# Patient Record
Sex: Female | Born: 1950 | Race: White | Hispanic: No | Smoking: Current every day smoker
Health system: Southern US, Community
[De-identification: ages and names within clinical notes are randomized; demographics above are authoritative.]

## PROBLEM LIST (undated history)

## (undated) DIAGNOSIS — N189 Chronic kidney disease, unspecified: Secondary | ICD-10-CM

## (undated) DIAGNOSIS — E559 Vitamin D deficiency, unspecified: Secondary | ICD-10-CM

## (undated) DIAGNOSIS — M1990 Unspecified osteoarthritis, unspecified site: Secondary | ICD-10-CM

## (undated) DIAGNOSIS — R112 Nausea with vomiting, unspecified: Secondary | ICD-10-CM

## (undated) DIAGNOSIS — I739 Peripheral vascular disease, unspecified: Secondary | ICD-10-CM

## (undated) DIAGNOSIS — E039 Hypothyroidism, unspecified: Secondary | ICD-10-CM

## (undated) DIAGNOSIS — E213 Hyperparathyroidism, unspecified: Secondary | ICD-10-CM

## (undated) DIAGNOSIS — F329 Major depressive disorder, single episode, unspecified: Secondary | ICD-10-CM

## (undated) DIAGNOSIS — R52 Pain, unspecified: Secondary | ICD-10-CM

## (undated) DIAGNOSIS — F419 Anxiety disorder, unspecified: Secondary | ICD-10-CM

## (undated) DIAGNOSIS — Z9889 Other specified postprocedural states: Secondary | ICD-10-CM

## (undated) DIAGNOSIS — D649 Anemia, unspecified: Secondary | ICD-10-CM

## (undated) DIAGNOSIS — F32A Depression, unspecified: Secondary | ICD-10-CM

## (undated) DIAGNOSIS — K219 Gastro-esophageal reflux disease without esophagitis: Secondary | ICD-10-CM

## (undated) DIAGNOSIS — G4730 Sleep apnea, unspecified: Secondary | ICD-10-CM

## (undated) DIAGNOSIS — I10 Essential (primary) hypertension: Secondary | ICD-10-CM

## (undated) DIAGNOSIS — K760 Fatty (change of) liver, not elsewhere classified: Secondary | ICD-10-CM

## (undated) DIAGNOSIS — Z86718 Personal history of other venous thrombosis and embolism: Secondary | ICD-10-CM

## (undated) HISTORY — DX: Hypothyroidism, unspecified: E03.9

## (undated) HISTORY — DX: Chronic kidney disease, unspecified: N18.9

## (undated) HISTORY — PX: TONSILLECTOMY: SUR1361

## (undated) HISTORY — PX: OTHER SURGICAL HISTORY: SHX169

## (undated) HISTORY — DX: Essential (primary) hypertension: I10

## (undated) HISTORY — PX: JOINT REPLACEMENT: SHX530

## (undated) HISTORY — DX: Unspecified osteoarthritis, unspecified site: M19.90

## (undated) HISTORY — DX: Vitamin D deficiency, unspecified: E55.9

## (undated) HISTORY — DX: Fatty (change of) liver, not elsewhere classified: K76.0

## (undated) HISTORY — DX: Personal history of other venous thrombosis and embolism: Z86.718

## (undated) HISTORY — PX: BONE CYST EXCISION: SHX376

## (undated) HISTORY — DX: Hyperparathyroidism, unspecified: E21.3

---
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
## 1977-08-20 HISTORY — PX: TUBAL LIGATION: SHX77

## 2001-01-22 ENCOUNTER — Other Ambulatory Visit: Admission: RE | Admit: 2001-01-22 | Discharge: 2001-01-22 | Payer: Self-pay | Admitting: Obstetrics and Gynecology

## 2001-01-22 ENCOUNTER — Encounter (INDEPENDENT_AMBULATORY_CARE_PROVIDER_SITE_OTHER): Payer: Self-pay

## 2001-01-27 ENCOUNTER — Ambulatory Visit (HOSPITAL_COMMUNITY): Admission: RE | Admit: 2001-01-27 | Discharge: 2001-01-27 | Payer: Self-pay | Admitting: Gastroenterology

## 2002-08-24 ENCOUNTER — Encounter: Admission: RE | Admit: 2002-08-24 | Discharge: 2002-08-24 | Payer: Self-pay | Admitting: Obstetrics and Gynecology

## 2002-08-24 ENCOUNTER — Encounter: Payer: Self-pay | Admitting: Obstetrics and Gynecology

## 2002-09-09 ENCOUNTER — Encounter: Payer: Self-pay | Admitting: Obstetrics and Gynecology

## 2002-09-09 ENCOUNTER — Encounter: Admission: RE | Admit: 2002-09-09 | Discharge: 2002-09-09 | Payer: Self-pay | Admitting: Obstetrics and Gynecology

## 2004-12-27 ENCOUNTER — Observation Stay (HOSPITAL_COMMUNITY): Admission: EM | Admit: 2004-12-27 | Discharge: 2004-12-28 | Payer: Self-pay | Admitting: Emergency Medicine

## 2005-08-20 DIAGNOSIS — I739 Peripheral vascular disease, unspecified: Secondary | ICD-10-CM

## 2005-08-20 DIAGNOSIS — Z86718 Personal history of other venous thrombosis and embolism: Secondary | ICD-10-CM

## 2005-08-20 HISTORY — DX: Peripheral vascular disease, unspecified: I73.9

## 2005-08-20 HISTORY — PX: OTHER SURGICAL HISTORY: SHX169

## 2005-08-20 HISTORY — DX: Personal history of other venous thrombosis and embolism: Z86.718

## 2005-12-17 ENCOUNTER — Encounter: Payer: Self-pay | Admitting: Vascular Surgery

## 2005-12-17 ENCOUNTER — Ambulatory Visit: Admission: RE | Admit: 2005-12-17 | Discharge: 2005-12-17 | Payer: Self-pay | Admitting: Nephrology

## 2005-12-18 ENCOUNTER — Other Ambulatory Visit: Admission: RE | Admit: 2005-12-18 | Discharge: 2005-12-18 | Payer: Self-pay | Admitting: Obstetrics and Gynecology

## 2005-12-20 ENCOUNTER — Emergency Department (HOSPITAL_COMMUNITY): Admission: EM | Admit: 2005-12-20 | Discharge: 2005-12-20 | Payer: Self-pay | Admitting: Emergency Medicine

## 2005-12-20 ENCOUNTER — Ambulatory Visit: Admission: RE | Admit: 2005-12-20 | Discharge: 2005-12-20 | Payer: Self-pay | Admitting: Nephrology

## 2005-12-20 ENCOUNTER — Encounter (INDEPENDENT_AMBULATORY_CARE_PROVIDER_SITE_OTHER): Payer: Self-pay | Admitting: *Deleted

## 2005-12-21 ENCOUNTER — Encounter (INDEPENDENT_AMBULATORY_CARE_PROVIDER_SITE_OTHER): Payer: Self-pay | Admitting: Specialist

## 2005-12-21 ENCOUNTER — Ambulatory Visit (HOSPITAL_COMMUNITY): Admission: RE | Admit: 2005-12-21 | Discharge: 2005-12-23 | Payer: Self-pay | Admitting: Vascular Surgery

## 2006-02-18 ENCOUNTER — Encounter: Admission: RE | Admit: 2006-02-18 | Discharge: 2006-02-18 | Payer: Self-pay | Admitting: Emergency Medicine

## 2006-02-27 ENCOUNTER — Encounter: Admission: RE | Admit: 2006-02-27 | Discharge: 2006-02-27 | Payer: Self-pay | Admitting: Emergency Medicine

## 2006-03-18 ENCOUNTER — Encounter: Admission: RE | Admit: 2006-03-18 | Discharge: 2006-03-18 | Payer: Self-pay | Admitting: Nephrology

## 2006-08-20 HISTORY — PX: CARPAL TUNNEL RELEASE: SHX101

## 2007-01-10 ENCOUNTER — Ambulatory Visit: Payer: Self-pay | Admitting: Vascular Surgery

## 2007-09-18 ENCOUNTER — Encounter: Admission: RE | Admit: 2007-09-18 | Discharge: 2007-09-18 | Payer: Self-pay | Admitting: Nephrology

## 2007-09-19 ENCOUNTER — Ambulatory Visit: Payer: Self-pay | Admitting: Gastroenterology

## 2007-09-19 LAB — CONVERTED CEMR LAB
Ferritin: 46.2 ng/mL (ref 10.0–291.0)
HCV Ab: NEGATIVE
Hepatitis B Surface Ag: NEGATIVE
INR: 0.9 (ref 0.8–1.0)
Iron: 77 ug/dL (ref 42–145)
Prothrombin Time: 11.7 s (ref 10.9–13.3)
aPTT: 37.6 s — ABNORMAL HIGH (ref 21.7–29.8)

## 2007-09-26 ENCOUNTER — Ambulatory Visit (HOSPITAL_COMMUNITY): Admission: RE | Admit: 2007-09-26 | Discharge: 2007-09-26 | Payer: Self-pay | Admitting: Gastroenterology

## 2007-12-16 ENCOUNTER — Telehealth: Payer: Self-pay | Admitting: Gastroenterology

## 2008-03-04 ENCOUNTER — Encounter: Admission: RE | Admit: 2008-03-04 | Discharge: 2008-03-04 | Payer: Self-pay | Admitting: Emergency Medicine

## 2008-03-11 ENCOUNTER — Ambulatory Visit: Payer: Self-pay | Admitting: *Deleted

## 2008-07-26 ENCOUNTER — Encounter: Admission: RE | Admit: 2008-07-26 | Discharge: 2008-07-26 | Payer: Self-pay | Admitting: Emergency Medicine

## 2008-07-28 ENCOUNTER — Encounter: Admission: RE | Admit: 2008-07-28 | Discharge: 2008-07-28 | Payer: Self-pay | Admitting: Emergency Medicine

## 2008-08-20 HISTORY — PX: OTHER SURGICAL HISTORY: SHX169

## 2008-10-14 ENCOUNTER — Ambulatory Visit: Payer: Self-pay | Admitting: *Deleted

## 2008-10-27 ENCOUNTER — Ambulatory Visit: Payer: Self-pay | Admitting: Internal Medicine

## 2008-10-27 DIAGNOSIS — Z86718 Personal history of other venous thrombosis and embolism: Secondary | ICD-10-CM | POA: Insufficient documentation

## 2008-10-27 DIAGNOSIS — J4489 Other specified chronic obstructive pulmonary disease: Secondary | ICD-10-CM | POA: Insufficient documentation

## 2008-10-27 DIAGNOSIS — E039 Hypothyroidism, unspecified: Secondary | ICD-10-CM | POA: Insufficient documentation

## 2008-10-27 DIAGNOSIS — I10 Essential (primary) hypertension: Secondary | ICD-10-CM

## 2008-10-27 DIAGNOSIS — J449 Chronic obstructive pulmonary disease, unspecified: Secondary | ICD-10-CM

## 2008-10-27 LAB — CONVERTED CEMR LAB
ALT: 54 units/L — ABNORMAL HIGH (ref 0–35)
Albumin: 3.8 g/dL (ref 3.5–5.2)
Basophils Absolute: 0 10*3/uL (ref 0.0–0.1)
CO2: 30 meq/L (ref 19–32)
Chloride: 102 meq/L (ref 96–112)
Creatinine, Ser: 1.1 mg/dL (ref 0.4–1.2)
Eosinophils Absolute: 0.2 10*3/uL (ref 0.0–0.7)
GFR calc non Af Amer: 54 mL/min
HCT: 43.2 % (ref 36.0–46.0)
Hemoglobin: 14.9 g/dL (ref 12.0–15.0)
MCV: 86.9 fL (ref 78.0–100.0)
Monocytes Absolute: 0.6 10*3/uL (ref 0.1–1.0)
Neutrophils Relative %: 47 % (ref 43.0–77.0)
Potassium: 4.3 meq/L (ref 3.5–5.1)
RBC: 4.97 M/uL (ref 3.87–5.11)
Sodium: 140 meq/L (ref 135–145)
T3 Uptake Ratio: 38.6 % — ABNORMAL HIGH (ref 22.5–37.0)
T3, Free: 3.9 pg/mL (ref 2.3–4.2)
TSH: 0.06 microintl units/mL — ABNORMAL LOW (ref 0.35–5.50)
Total Protein: 7.3 g/dL (ref 6.0–8.3)
WBC: 6.2 10*3/uL (ref 4.5–10.5)

## 2008-10-28 ENCOUNTER — Encounter: Payer: Self-pay | Admitting: Internal Medicine

## 2008-11-02 ENCOUNTER — Telehealth: Payer: Self-pay | Admitting: Internal Medicine

## 2008-12-30 ENCOUNTER — Encounter: Admission: RE | Admit: 2008-12-30 | Discharge: 2008-12-30 | Payer: Self-pay | Admitting: Orthopedic Surgery

## 2008-12-31 ENCOUNTER — Ambulatory Visit: Payer: Self-pay | Admitting: Internal Medicine

## 2008-12-31 ENCOUNTER — Ambulatory Visit (HOSPITAL_BASED_OUTPATIENT_CLINIC_OR_DEPARTMENT_OTHER): Admission: RE | Admit: 2008-12-31 | Discharge: 2008-12-31 | Payer: Self-pay | Admitting: Orthopedic Surgery

## 2008-12-31 DIAGNOSIS — R7309 Other abnormal glucose: Secondary | ICD-10-CM | POA: Insufficient documentation

## 2008-12-31 DIAGNOSIS — E669 Obesity, unspecified: Secondary | ICD-10-CM | POA: Insufficient documentation

## 2008-12-31 LAB — CONVERTED CEMR LAB
BUN: 12 mg/dL (ref 6–23)
CO2: 31 meq/L (ref 19–32)
Creatinine, Ser: 1.1 mg/dL (ref 0.4–1.2)
GFR calc non Af Amer: 54.37 mL/min (ref >60)
Glucose, Bld: 115 mg/dL — ABNORMAL HIGH (ref 70–99)
Hgb A1c MFr Bld: 6 % (ref 4.6–6.5)
Potassium: 4.2 meq/L (ref 3.5–5.1)

## 2009-01-18 ENCOUNTER — Telehealth: Payer: Self-pay | Admitting: Internal Medicine

## 2009-01-18 ENCOUNTER — Encounter: Admission: RE | Admit: 2009-01-18 | Discharge: 2009-01-18 | Payer: Self-pay | Admitting: Internal Medicine

## 2009-01-26 ENCOUNTER — Telehealth: Payer: Self-pay | Admitting: Internal Medicine

## 2009-02-22 ENCOUNTER — Ambulatory Visit: Payer: Self-pay | Admitting: Internal Medicine

## 2009-05-06 ENCOUNTER — Ambulatory Visit: Payer: Self-pay | Admitting: Internal Medicine

## 2009-05-09 ENCOUNTER — Encounter: Payer: Self-pay | Admitting: Internal Medicine

## 2009-05-12 ENCOUNTER — Telehealth: Payer: Self-pay | Admitting: Internal Medicine

## 2009-06-06 ENCOUNTER — Emergency Department (HOSPITAL_COMMUNITY): Admission: EM | Admit: 2009-06-06 | Discharge: 2009-06-06 | Payer: Self-pay | Admitting: Emergency Medicine

## 2009-06-08 ENCOUNTER — Ambulatory Visit: Payer: Self-pay | Admitting: Internal Medicine

## 2009-06-08 DIAGNOSIS — R9431 Abnormal electrocardiogram [ECG] [EKG]: Secondary | ICD-10-CM

## 2009-06-08 LAB — CONVERTED CEMR LAB
BUN: 12 mg/dL (ref 6–23)
Calcium: 9.2 mg/dL (ref 8.4–10.5)
Chloride: 102 meq/L (ref 96–112)
Creatinine, Ser: 1 mg/dL (ref 0.4–1.2)
Magnesium: 2.4 mg/dL (ref 1.5–2.5)
Sodium: 142 meq/L (ref 135–145)

## 2009-06-13 ENCOUNTER — Telehealth (INDEPENDENT_AMBULATORY_CARE_PROVIDER_SITE_OTHER): Payer: Self-pay | Admitting: *Deleted

## 2009-06-14 ENCOUNTER — Encounter (HOSPITAL_COMMUNITY): Admission: RE | Admit: 2009-06-14 | Discharge: 2009-08-16 | Payer: Self-pay | Admitting: Internal Medicine

## 2009-06-14 ENCOUNTER — Ambulatory Visit: Payer: Self-pay

## 2009-06-14 ENCOUNTER — Ambulatory Visit: Payer: Self-pay | Admitting: Cardiology

## 2009-09-13 ENCOUNTER — Encounter (INDEPENDENT_AMBULATORY_CARE_PROVIDER_SITE_OTHER): Payer: Self-pay | Admitting: *Deleted

## 2009-09-13 ENCOUNTER — Ambulatory Visit: Payer: Self-pay | Admitting: Internal Medicine

## 2009-09-13 DIAGNOSIS — F17200 Nicotine dependence, unspecified, uncomplicated: Secondary | ICD-10-CM | POA: Insufficient documentation

## 2009-09-16 ENCOUNTER — Encounter: Payer: Self-pay | Admitting: Internal Medicine

## 2009-09-16 ENCOUNTER — Telehealth: Payer: Self-pay | Admitting: Internal Medicine

## 2009-10-04 ENCOUNTER — Encounter: Payer: Self-pay | Admitting: Internal Medicine

## 2009-10-31 ENCOUNTER — Telehealth: Payer: Self-pay | Admitting: Internal Medicine

## 2009-11-21 ENCOUNTER — Encounter: Payer: Self-pay | Admitting: Internal Medicine

## 2010-02-10 ENCOUNTER — Ambulatory Visit: Payer: Self-pay | Admitting: Internal Medicine

## 2010-02-13 ENCOUNTER — Encounter: Payer: Self-pay | Admitting: Internal Medicine

## 2010-02-15 ENCOUNTER — Telehealth: Payer: Self-pay | Admitting: Internal Medicine

## 2010-03-01 ENCOUNTER — Ambulatory Visit: Payer: Self-pay | Admitting: Internal Medicine

## 2010-04-04 ENCOUNTER — Encounter: Payer: Self-pay | Admitting: Internal Medicine

## 2010-04-04 LAB — CONVERTED CEMR LAB
Albumin: 4.3 g/dL
Calcium: 9.6 mg/dL
Creatinine, Ser: 1.14 mg/dL
GFR calc non Af Amer: 53 mL/min
Glucose, Urine, Semiquant: 122
HCT: 42.5 %
Hemoglobin: 14.9 g/dL
Lymphocytes, automated: 37 %
MCV: 85 fL
Neutrophils Relative %: 52 %
TSH: 0.495 microintl units/mL
Total Protein: 7.2 g/dL

## 2010-05-03 LAB — HM PAP SMEAR: HM Pap smear: NORMAL

## 2010-05-13 ENCOUNTER — Emergency Department (HOSPITAL_COMMUNITY): Admission: EM | Admit: 2010-05-13 | Discharge: 2010-05-13 | Payer: Self-pay | Admitting: Emergency Medicine

## 2010-05-23 ENCOUNTER — Ambulatory Visit: Payer: Self-pay | Admitting: Vascular Surgery

## 2010-05-29 ENCOUNTER — Ambulatory Visit: Payer: Self-pay | Admitting: Internal Medicine

## 2010-05-29 ENCOUNTER — Telehealth (INDEPENDENT_AMBULATORY_CARE_PROVIDER_SITE_OTHER): Payer: Self-pay | Admitting: *Deleted

## 2010-05-29 DIAGNOSIS — M159 Polyosteoarthritis, unspecified: Secondary | ICD-10-CM

## 2010-05-29 DIAGNOSIS — M79609 Pain in unspecified limb: Secondary | ICD-10-CM

## 2010-06-01 ENCOUNTER — Telehealth: Payer: Self-pay | Admitting: Internal Medicine

## 2010-06-14 ENCOUNTER — Encounter: Payer: Self-pay | Admitting: Internal Medicine

## 2010-06-29 IMAGING — US US ASPIRATION
1 series · 8 of 8 positions shown · non-contrast
Comparison: 07/26/2008

CLINICAL DATA: Left knee Baker's cyst.

US ASPIRATION

[Series 1: us aspiration · 0.08mm/px · 8 acquisitions, 8 frames shown]
[im 1/8]
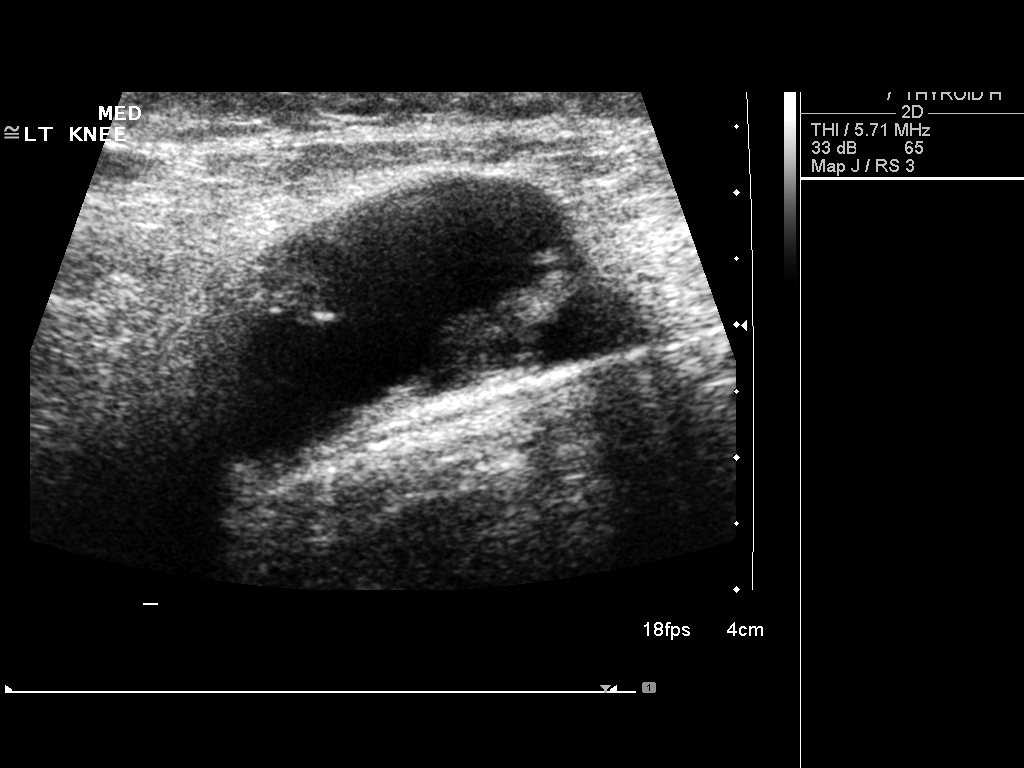
[im 2/8]
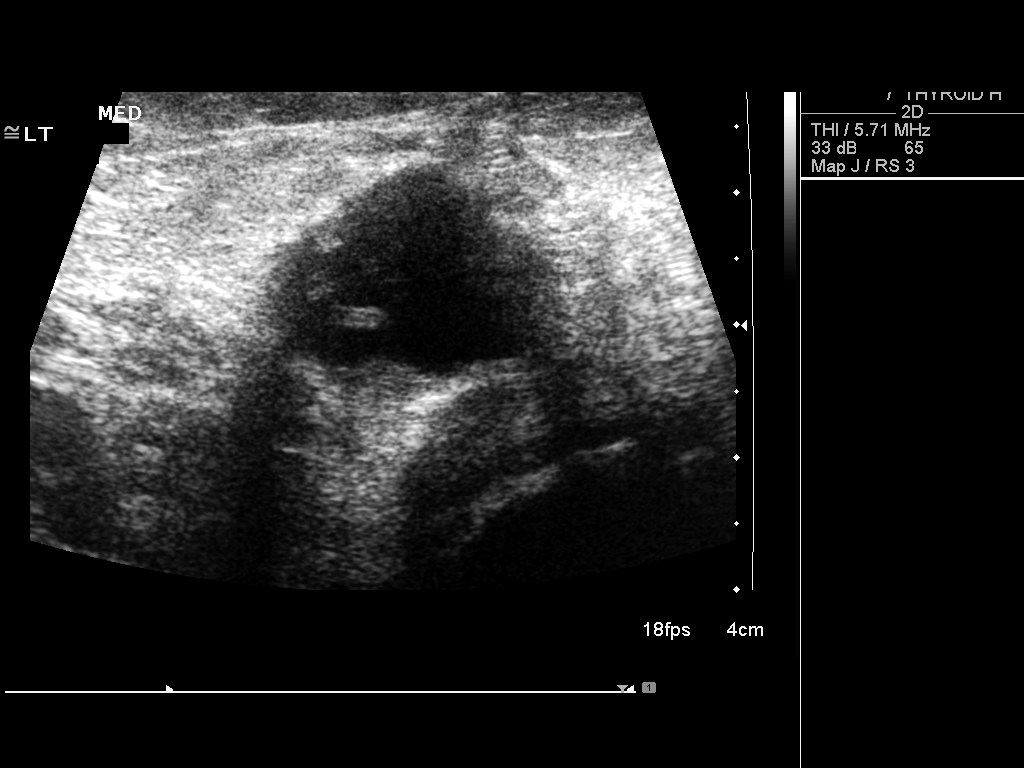
[im 3/8]
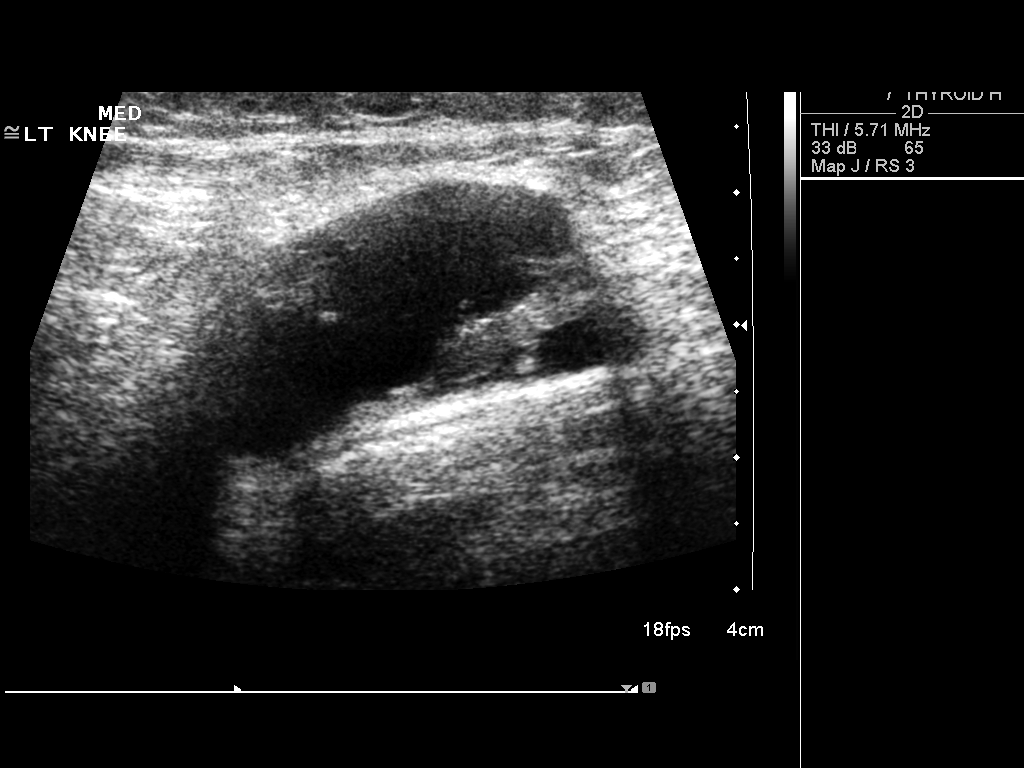
[im 4/8]
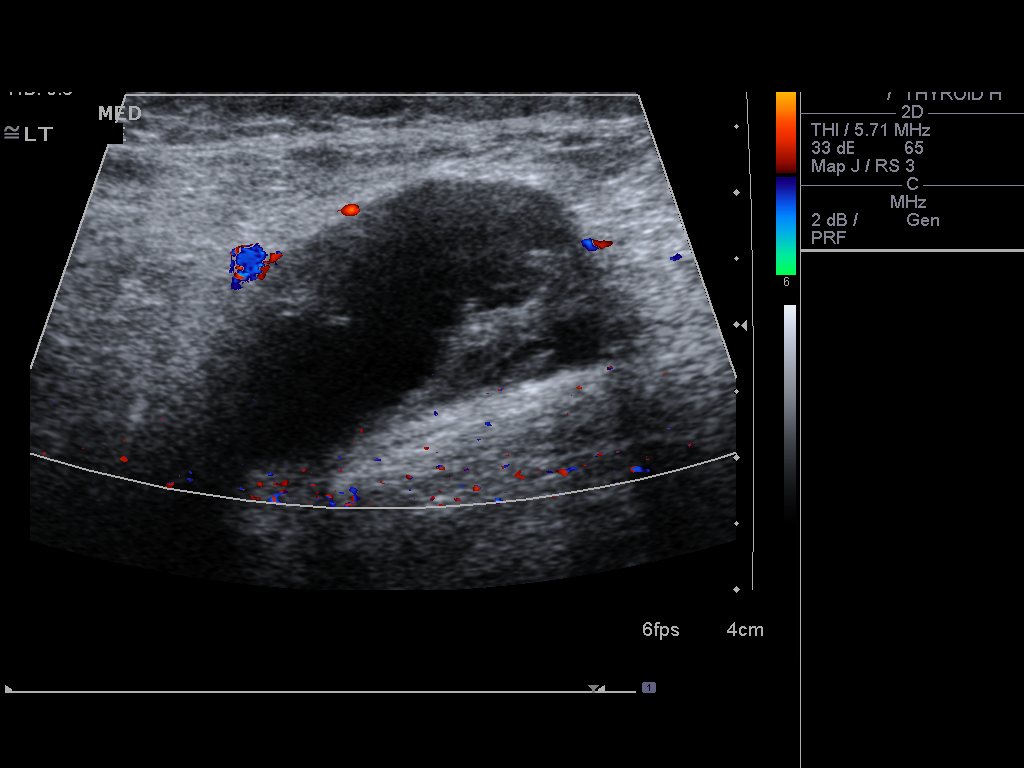
[im 5/8]
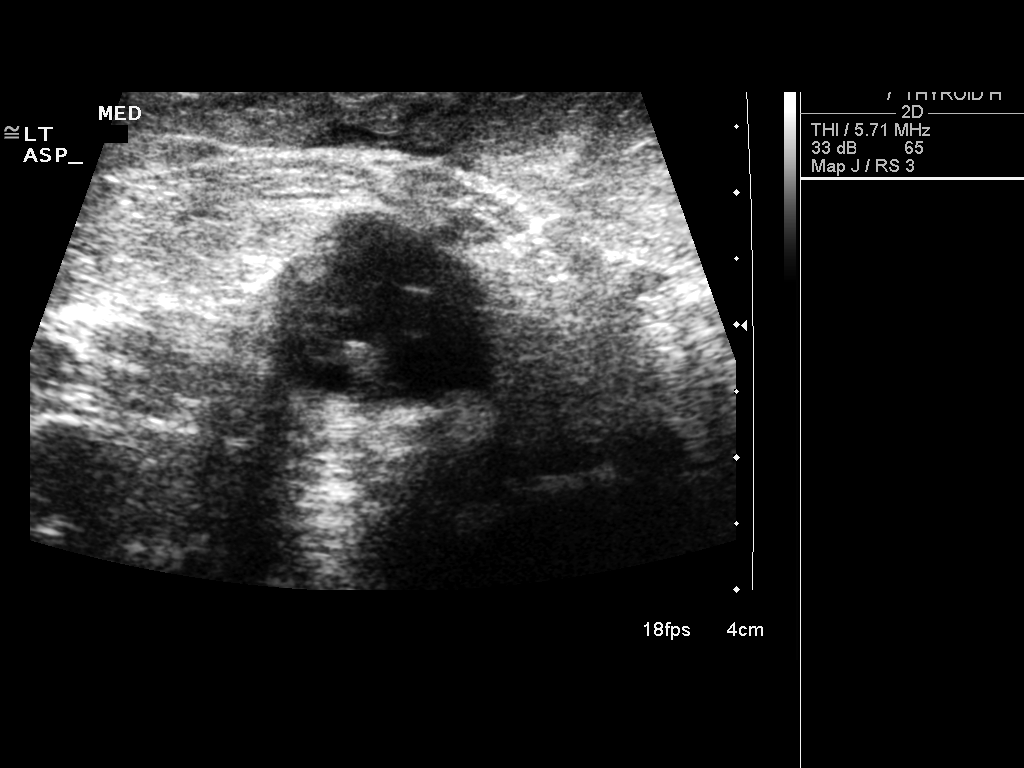
[im 6/8]
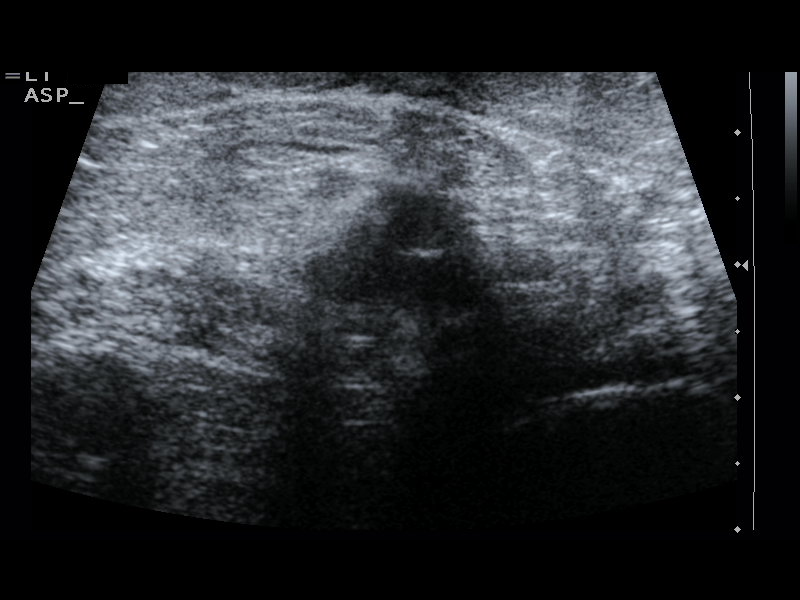
[im 7/8]
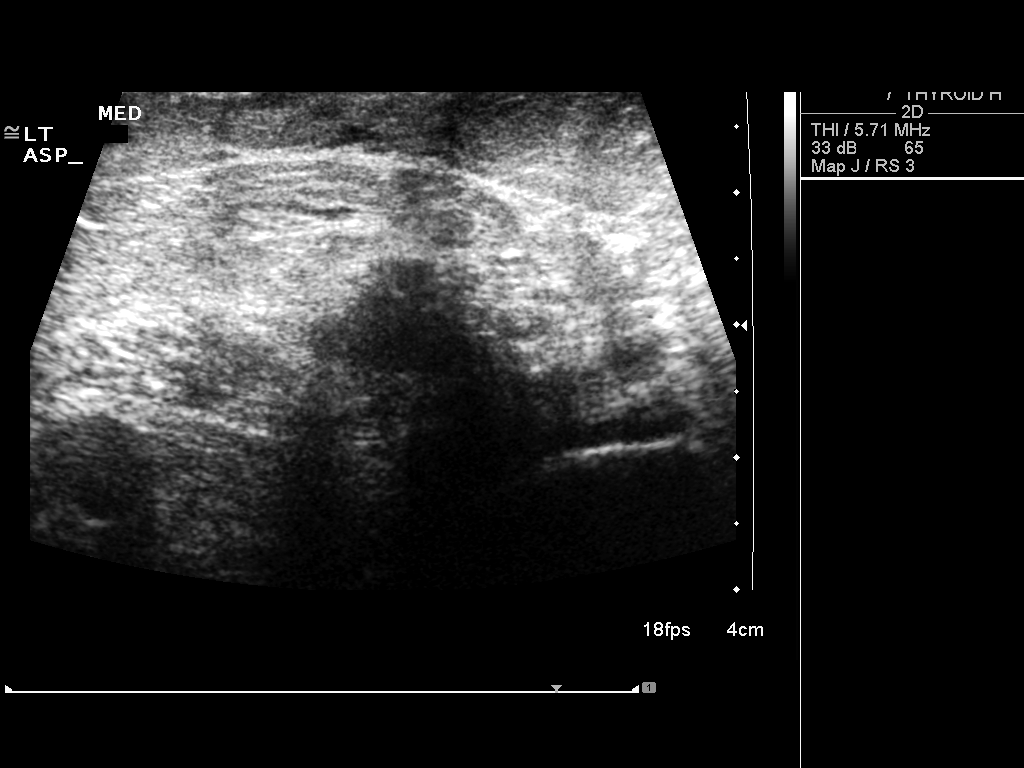
[im 8/8]
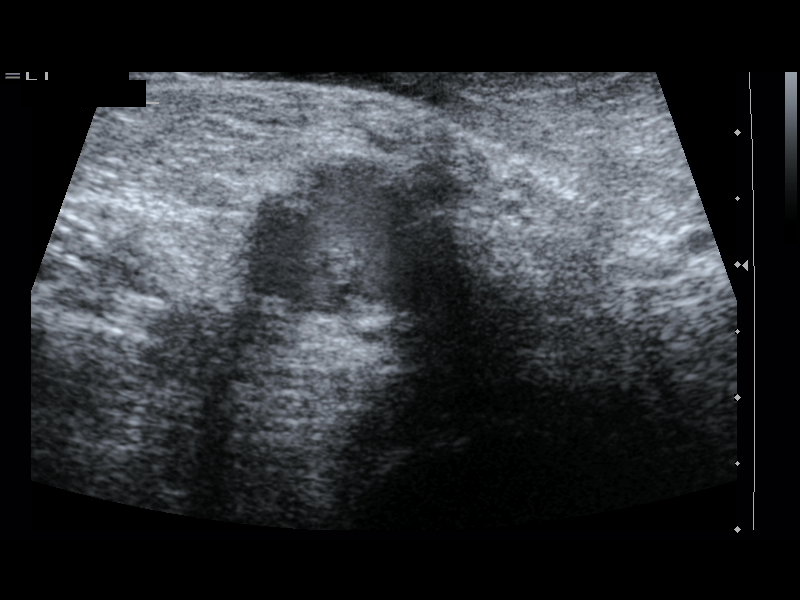

[8 of 8 positions shown; findings below may reference images not displayed]

FINDINGS: Written informed consent was obtained and the patient for
the procedure.  The patient was placed prone on the ultrasound
table and the left posterior knee was prepped and draped in sterile
fashion.  Ultrasound demonstrates a complex fluid collection in the
popliteal fossa.  The skin was anesthetized with 1% lidocaine.  A
22 gauge needle was advanced into the popliteal cyst with
ultrasound guidance.  Approximately 5 ml of clear yellow fluid was
aspirated.  A mixture of 5 ml of Sensorcaine and 80 mg of Depo-
Medrol was injected into the Baker's cyst.  The patient tolerated
procedure well.
IMPRESSION: Successful aspiration and injection of the left Baker's cyst as
above.

## 2010-08-04 ENCOUNTER — Ambulatory Visit: Payer: Self-pay | Admitting: Internal Medicine

## 2010-08-31 ENCOUNTER — Encounter: Payer: Self-pay | Admitting: Internal Medicine

## 2010-08-31 ENCOUNTER — Ambulatory Visit
Admission: RE | Admit: 2010-08-31 | Discharge: 2010-08-31 | Payer: Self-pay | Source: Home / Self Care | Attending: Internal Medicine | Admitting: Internal Medicine

## 2010-08-31 DIAGNOSIS — H8309 Labyrinthitis, unspecified ear: Secondary | ICD-10-CM | POA: Insufficient documentation

## 2010-09-12 ENCOUNTER — Encounter: Payer: Self-pay | Admitting: Internal Medicine

## 2010-09-12 LAB — CONVERTED CEMR LAB
ALT: 43 units/L
Alkaline Phosphatase: 86 units/L
BUN: 16 mg/dL
Calcium: 9.6 mg/dL
Chloride: 103 meq/L
Creatinine, Ser: 0.98 mg/dL
Eosinophils Relative: 0.1 %
GFR calc non Af Amer: 64 mL/min
Glucose, Bld: 105 mg/dL
MCV: 80 fL
Potassium: 4.3 meq/L
RBC: 5.3 M/uL
RDW: 15.6 %
Sodium: 140 meq/L
TSH: 0.325 microintl units/mL
Total Bilirubin: 0.4 mg/dL
Total Protein: 7.4 g/dL
WBC: 7.4 10*3/uL

## 2010-09-14 ENCOUNTER — Ambulatory Visit
Admission: RE | Admit: 2010-09-14 | Discharge: 2010-09-14 | Payer: Self-pay | Source: Home / Self Care | Attending: Internal Medicine | Admitting: Internal Medicine

## 2010-09-14 DIAGNOSIS — R740 Nonspecific elevation of levels of transaminase and lactic acid dehydrogenase [LDH]: Secondary | ICD-10-CM

## 2010-09-14 DIAGNOSIS — E559 Vitamin D deficiency, unspecified: Secondary | ICD-10-CM | POA: Insufficient documentation

## 2010-09-14 DIAGNOSIS — K7689 Other specified diseases of liver: Secondary | ICD-10-CM | POA: Insufficient documentation

## 2010-09-21 NOTE — Progress Notes (Signed)
Summary: Work note/ VAL OV  Phone Note Call from Patient Call back at [employer] [phone]   Caller: Patient Summary of Call: pt called stating that she was unable to go to work today and is requesting an extension to previous work note to include today. Initial call taken by: Margaret Pyle, CMA,  September 16, 2009 11:36 AM  Follow-up for Phone Call        ok Follow-up by: Etta Grandchild MD,  September 16, 2009 1:27 PM  Additional Follow-up for Phone Call Additional follow up Details #1::        Patient notified and letter done Additional Follow-up by: Rock Nephew CMA,  September 16, 2009 3:24 PM

## 2010-09-21 NOTE — Letter (Signed)
Summary: Out of Work  [facility]  [address] [place], [place] [address]   Phone: [phone]  Fax: [phone]    August 31, 2010   Employee:  QIANNA CLAGETT Lanes    To Whom It May Concern:   For Medical reasons, please excuse the above named employee from work for the following dates:  Start:   08/31/10  End:   09/04/10  If you need additional information, please feel free to contact our office.         Sincerely,    Etta Grandchild MD

## 2010-09-21 NOTE — Assessment & Plan Note (Signed)
Summary: FU  STC   Vital Signs:  Patient profile:   60 year old female Height:      66 inches Weight:      273 pounds BMI:     44.22 O2 Sat:      95 % on Room air Temp:     98.1 degrees F oral Pulse rate:   75 / minute Pulse rhythm:   regular Resp:     16 per minute BP sitting:   138 / 82  (left arm) Cuff size:   large  Vitals Entered By: Rock Nephew CMA (February 10, 2010 3:16 PM)  Nutrition Counseling: Patient's BMI is greater than 25 and therefore counseled on weight management options.  O2 Flow:  Room air CC: follow up and med refills, Hypertension Management Is Patient Diabetic? No Pain Assessment Patient in pain? no        Primary Care Provider:  Etta Grandchild MD  CC:  follow up and med refills and Hypertension Management.  History of Present Illness:  Follow-Up Visit      This is a 60 year old woman who presents for Follow-up visit.  The patient denies chest pain, palpitations, dizziness, syncope, low blood sugar symptoms, high blood sugar symptoms, edema, SOB, DOE, PND, and orthopnea.  Since the last visit the patient notes no new problems or concerns.  The patient reports taking meds as prescribed, monitoring BP, monitoring blood sugars, and dietary noncompliance.  When questioned about possible medication side effects, the patient notes none.    Hypertension History:      She denies headache, chest pain, palpitations, dyspnea with exertion, orthopnea, PND, peripheral edema, visual symptoms, neurologic problems, syncope, and side effects from treatment.  She notes no problems with any antihypertensive medication side effects.        Positive major cardiovascular risk factors include female age 59 years old or older and hypertension.  Negative major cardiovascular risk factors include no history of diabetes or hyperlipidemia, negative family history for ischemic heart disease, and non-tobacco-user status.        Further assessment for target organ damage reveals no  history of ASHD, cardiac end-organ damage (CHF/LVH), stroke/TIA, peripheral vascular disease, renal insufficiency, or hypertensive retinopathy.     Preventive Screening-Counseling & Management  Alcohol-Tobacco     Alcohol drinks/day: 0     Smoking Status: never     Smoking Cessation Counseling: yes     Smoke Cessation Stage: precontemplative     Tobacco Counseling: to quit use of tobacco products  Caffeine-Diet-Exercise     Does Patient Exercise: no     Exercise Counseling: to improve exercise regimen     PHQ-9 Score: 1-4 minimal depression  Hep-HIV-STD-Contraception     Hepatitis Risk: no risk noted     HIV Risk: no risk noted     STD Risk: no risk noted      Drug Use:  no.    Clinical Review Panels:  Diabetes Management   HgBA1C:  6.0 (12/31/2008)   Creatinine:  1.0 (06/08/2009)  CBC   WBC:  6.2 (10/27/2008)   RBC:  4.97 (10/27/2008)   Hgb:  14.9 (10/27/2008)   Hct:  43.2 (10/27/2008)   Platelets:  198 (10/27/2008)   MCV  86.9 (10/27/2008)   MCHC  34.6 (10/27/2008)   RDW  12.5 (10/27/2008)   PMN:  47.0 (10/27/2008)   Lymphs:  38.8 (10/27/2008)   Monos:  10.2 (10/27/2008)   Eosinophils:  3.6 (10/27/2008)   Basophil:  0.4 (10/27/2008)  Complete Metabolic Panel   Glucose:  83 (06/08/2009)   Sodium:  142 (06/08/2009)   Potassium:  3.1 (06/08/2009)   Chloride:  102 (06/08/2009)   CO2:  32 (06/08/2009)   BUN:  12 (06/08/2009)   Creatinine:  1.0 (06/08/2009)   Albumin:  3.8 (10/27/2008)   Total Protein:  7.3 (10/27/2008)   Calcium:  9.2 (06/08/2009)   Total Bili:  0.8 (10/27/2008)   Alk Phos:  77 (10/27/2008)   SGPT (ALT):  54 (10/27/2008)   SGOT (AST):  44 (10/27/2008)   Medications Prior to Update: 1)  Wellbutrin Xl 300 Mg Xr24h-Tab (Bupropion Hcl) .... Take 1 Tablet By Mouth Once A Day 2)  Hydrochlorothiazide 12.5 Mg Tabs (Hydrochlorothiazide) .... Take 1 Tablet By Mouth Once A Day 3)  Clonazepam 0.5 Mg Tabs (Clonazepam) .... Take 1 Tablet By Mouth Two  Times A Day 4)  Zoloft 100 Mg Tabs (Sertraline Hcl) .... Take 1 Tablet By Mouth Once A Day 5)  Protonix 40 Mg Tbec (Pantoprazole Sodium) .... Take 1 Tablet By Mouth Once A Day 6)  Bayer Aspirin 81 7)  Toprol Xl 50 Mg Xr24h-Tab (Metoprolol Succinate) .... Once Daily 8)  Klor-Con 10 10 Meq Cr-Tabs (Potassium Chloride) .... One By Mouth Two Times A Day With Food 9)  Synthroid 100 Mcg Tabs (Levothyroxine Sodium) .... Take 1 By Mouth Qd 10)  Augmentin 875-125 Mg Tabs (Amoxicillin-Pot Clavulanate) .Marland Kitchen.. 1 By Mouth Two Times A Day X 7 Days 11)  Tussionex Pennkinetic Er 8-10 Mg/79ml Lqcr (Chlorpheniramine-Hydrocodone) .... 5 Cc Every 12 H By Mouth As Needed For Severe Cough  Current Medications (verified): 1)  Wellbutrin Xl 300 Mg Xr24h-Tab (Bupropion Hcl) .... Take 1 Tablet By Mouth Once A Day 2)  Hydrochlorothiazide 12.5 Mg Tabs (Hydrochlorothiazide) .... Take 1 Tablet By Mouth Once A Day 3)  Clonazepam 0.5 Mg Tabs (Clonazepam) .... Take 1 Tablet By Mouth Two Times A Day 4)  Zoloft 100 Mg Tabs (Sertraline Hcl) .... Take 1 Tablet By Mouth Once A Day 5)  Protonix 40 Mg Tbec (Pantoprazole Sodium) .... Take 1 Tablet By Mouth Once A Day 6)  Bayer Aspirin 81 7)  Toprol Xl 50 Mg Xr24h-Tab (Metoprolol Succinate) .... Once Daily 8)  Klor-Con 10 10 Meq Cr-Tabs (Potassium Chloride) .... One By Mouth Two Times A Day With Food 9)  Synthroid 150 Mcg Tabs (Levothyroxine Sodium) .... Take 1 Tablet By Mouth Once A Day  Allergies (verified): 1)  ! Sulfa 2)  ! 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 Family History of Arthritis  Social History: Reviewed history from 12/31/2008 and no changes required. Occupation: Civil engineer, contracting at [employer] Alcohol use-no Drug use-no Regular exercise-no Hepatitis Risk:  no risk noted HIV Risk:  no risk noted STD Risk:  no risk noted  Review of Systems Endo:  Denies cold intolerance, excessive hunger, excessive thirst, excessive urination, heat intolerance, polyuria, and weight change.  Physical Exam  General:  alert, well-developed, well-nourished, and cooperative to examination.  overweight-appearing.   Head:  normocephalic and atraumatic.   Mouth:  teeth and gums in good repair; mucous membranes moist, without lesions or ulcers. oropharynx clear without exudate, mild erythema.  Neck:  supple, full ROM, no masses, no thyromegaly, and no neck tenderness.   Lungs:  normal respiratory effort, no intercostal retractions, no accessory muscle use, normal breath sounds, no dullness, no crackles, and no wheezes.   Heart:  normal rate, regular rhythm, no murmur, no gallop, no rub, and no JVD.   Abdomen:  soft, non-tender, normal bowel sounds, no distention, no masses, no guarding, no rigidity, no rebound tenderness, no abdominal hernia, no hepatomegaly, and no splenomegaly.   Msk:  normal ROM, no joint tenderness, no joint swelling, no joint warmth, no redness over joints, and no joint deformities.   Pulses:  R and L carotid,radial,femoral,dorsalis pedis and posterior tibial pulses are full and equal bilaterally Extremities:  No clubbing, cyanosis, edema, or deformity noted with normal full range of motion of all joints.  medium-sized varicosities noted on both legs. Neurologic:  No cranial nerve deficits noted. Station and gait are  normal. Plantar reflexes are down-going bilaterally. DTRs are symmetrical throughout. Sensory, motor and coordinative functions appear intact. Skin:  Intact without suspicious lesions or rashes Cervical Nodes:  No lymphadenopathy noted Psych:  Cognition and judgment appear intact. Alert and cooperative with normal attention span and concentration. No apparent delusions, illusions, hallucinations   Impression & Recommendations:  Problem # 1:  SMOKER (ICD-305.1) Assessment Unchanged  Encouraged smoking cessation and discussed different methods for smoking cessation.   Problem # 2:  HYPOKALEMIA (ICD-276.8) Assessment: Unchanged  Orders: Venipuncture ([address]) TLB-BMP (Basic Metabolic Panel-BMET) (80048-METABOL) TLB-Hepatic/Liver Function Pnl (80076-HEPATIC) TLB-TSH (Thyroid Stimulating Hormone) (84443-TSH) TLB-A1C / Hgb A1C (Glycohemoglobin) (83036-A1C)  Problem # 3:  HYPERGLYCEMIA, BORDERLINE (ICD-790.29) Assessment: Unchanged  Orders: Venipuncture ([address]) TLB-BMP (Basic Metabolic Panel-BMET) (80048-METABOL) TLB-Hepatic/Liver Function Pnl (80076-HEPATIC) TLB-TSH (Thyroid Stimulating Hormone) (84443-TSH) TLB-A1C / Hgb A1C (Glycohemoglobin) (83036-A1C)  Labs Reviewed: Creat: 1.0 (06/08/2009)     Problem # 4:  HYPOTHYROIDISM (ICD-244.9) Assessment: Unchanged  Her updated medication list for this problem includes:    Synthroid 150 Mcg Tabs (Levothyroxine sodium) .Marland Kitchen... Take 1 tablet by mouth once a day  Orders: Venipuncture ([address]) TLB-BMP (Basic Metabolic Panel-BMET) (80048-METABOL) TLB-Hepatic/Liver Function Pnl (80076-HEPATIC) TLB-TSH (Thyroid Stimulating Hormone) (84443-TSH) TLB-A1C / Hgb A1C (Glycohemoglobin) (83036-A1C)  Labs Reviewed: TSH: 0.06 (10/27/2008)   Total T4: 10.2 (10/27/2008)    HgBA1c: 6.0 (12/31/2008)  Problem # 5:  HYPERTENSION (ICD-401.9) Assessment: Improved  Her updated medication list for this problem includes:    Hydrochlorothiazide 12.5 Mg  Tabs (Hydrochlorothiazide) .Marland Kitchen... Take 1 tablet by mouth once a day    Toprol Xl 50 Mg Xr24h-tab (Metoprolol succinate) ..... Once daily  Orders: Venipuncture ([address]) TLB-BMP (Basic Metabolic Panel-BMET) (80048-METABOL) TLB-Hepatic/Liver Function Pnl (80076-HEPATIC) TLB-TSH (Thyroid Stimulating Hormone) (84443-TSH) TLB-A1C / Hgb A1C (Glycohemoglobin) (83036-A1C)  BP today: 138/82 Prior BP: 132/88 (09/13/2009)  Prior 10 Yr Risk Heart Disease: Not enough information (02/22/2009)  Labs Reviewed: K+: 3.1 (06/08/2009) Creat: : 1.0 (06/08/2009)     Complete Medication List: 1)  Wellbutrin Xl 300 Mg Xr24h-tab (Bupropion hcl) .... Take 1 tablet by mouth once a day 2)  Hydrochlorothiazide 12.5 Mg Tabs (Hydrochlorothiazide) .... Take 1 tablet by mouth once a day 3)  Clonazepam 0.5 Mg Tabs (Clonazepam) .... Take 1 tablet by mouth two times a day 4)  Zoloft 100 Mg Tabs (Sertraline hcl) .... Take 1 tablet by mouth once a  day 5)  Protonix 40 Mg Tbec (Pantoprazole sodium) .... Take 1 tablet by mouth once a day 6)  Bayer Aspirin 81  7)  Toprol Xl 50 Mg Xr24h-tab (Metoprolol succinate) .... Once daily 8)  Klor-con 10 10 Meq Cr-tabs (Potassium chloride) .... One by mouth two times a day with food 9)  Synthroid 150 Mcg Tabs (Levothyroxine sodium) .... Take 1 tablet by mouth once a day  Hypertension Assessment/Plan:      The patient's hypertensive risk group is category B: At least one risk factor (excluding diabetes) with no target organ damage.  Today's blood pressure is 138/82.  Her blood pressure goal is < 140/90.  Patient Instructions: 1)  Please schedule a follow-up appointment in 4 months. 2)  Tobacco is very bad for your health and your loved ones! You Should stop smoking!. 3)  Stop Smoking Tips: Choose a Quit date. Cut down before the Quit date. decide what you will do as a substitute when you feel the urge to smoke(gum,toothpick,exercise). 4)  It is important that you exercise regularly at  least 20 minutes 5 times a week. If you develop chest pain, have severe difficulty breathing, or feel very tired , stop exercising immediately and seek medical attention. 5)  You need to lose weight. Consider a lower calorie diet and regular exercise.  6)  Check your Blood Pressure regularly. If it is above 130/80: you should make an appointment. Prescriptions: CLONAZEPAM 0.5 MG TABS (CLONAZEPAM) Take 1 tablet by mouth two times a day  #180 x 1   Entered by:   Rock Nephew CMA   Authorized by:   Etta Grandchild MD   Signed by:   Rock Nephew CMA on 02/10/2010   Method used:   Print then Give to Patient   RxID:   [id number] KLOR-CON 10 10 MEQ CR-TABS (POTASSIUM CHLORIDE) One by mouth two times a day with food  #90 x 3   Entered by:   Rock Nephew CMA   Authorized by:   Etta Grandchild MD   Signed by:   Rock Nephew CMA on 02/10/2010   Method used:   Print then Give to Patient   RxID:   [id number] SYNTHROID 150 MCG TABS (LEVOTHYROXINE SODIUM) Take 1 tablet by mouth once a day  #90 x 3   Entered by:   Rock Nephew CMA   Authorized by:   Etta Grandchild MD   Signed by:   Rock Nephew CMA on 02/10/2010   Method used:   Print then Give to Patient   RxID:   [id number] TOPROL XL 50 MG XR24H-TAB (METOPROLOL SUCCINATE) once daily  #90 x 3   Entered by:   Rock Nephew CMA   Authorized by:   Etta Grandchild MD   Signed by:   Rock Nephew CMA on 02/10/2010   Method used:   Print then Give to Patient   RxID:   [id number] PROTONIX 40 MG TBEC (PANTOPRAZOLE SODIUM) Take 1 tablet by mouth once a day  #90 x 3   Entered by:   Rock Nephew CMA   Authorized by:   Etta Grandchild MD   Signed by:   Rock Nephew CMA on 02/10/2010   Method used:   Print then Give to Patient   RxID:   [id number] ZOLOFT 100 MG TABS (SERTRALINE HCL) Take 1 tablet by mouth once a day  #90 x 3   Entered by:   Rock Nephew CMA   Authorized by:  Etta Grandchild MD   Signed by:    Rock Nephew CMA on 02/10/2010   Method used:   Print then Give to Patient   RxID:   [id number] HYDROCHLOROTHIAZIDE 12.5 MG TABS (HYDROCHLOROTHIAZIDE) Take 1 tablet by mouth once a day  #90 x 3   Entered by:   Rock Nephew CMA   Authorized by:   Etta Grandchild MD   Signed by:   Rock Nephew CMA on 02/10/2010   Method used:   Print then Give to Patient   RxID:   [id number] WELLBUTRIN XL 300 MG XR24H-TAB (BUPROPION HCL) Take 1 tablet by mouth once a day  #90 x 3   Entered by:   Rock Nephew CMA   Authorized by:   Etta Grandchild MD   Signed by:   Rock Nephew CMA on 02/10/2010   Method used:   Print then Give to Patient   RxID:   [id number]

## 2010-09-21 NOTE — Assessment & Plan Note (Signed)
Summary: CONGESTION/NWS   Vital Signs:  Patient profile:   60 year old female Height:      66 inches (167.64 cm) Weight:      263.12 pounds (119.60 kg) O2 Sat:      97 % on Room air Temp:     97.5 degrees F (36.39 degrees C) oral Pulse rate:   64 / minute BP sitting:   132 / 88  (left arm) Cuff size:   large  Vitals Entered By: Orlan Leavens (September 13, 2009 2:41 PM)  O2 Flow:  Room air CC: chest congestion & cough. Pt states she has completed azithromycin 250mg   Is Patient Diabetic? No Pain Assessment Patient in pain? no        Primary Care Provider:  Etta Grandchild MD  CC:  chest congestion & cough. Pt states she has completed azithromycin 250mg  .  History of Present Illness: here today with complaint of chest congestion and cough. onset of symptoms was 5 days ago. course has been gradual onset and now occurs in daily but waxing/waning pattern. problem precipitated by sick contacts problem associated with yellow sputum and fatigue but not associated with fever, rash, ST or vision changes. symptoms improved by nothing - took zpack, tylenol cold and sinus. symptoms worsened with lying flat at night. no recent hx of same symptoms.   Current Medications (verified): 1)  Wellbutrin Xl 300 Mg Xr24h-Tab (Bupropion Hcl) .... Take 1 Tablet By Mouth Once A Day 2)  Hydrochlorothiazide 12.5 Mg Tabs (Hydrochlorothiazide) .... Take 1 Tablet By Mouth Once A Day 3)  Clonazepam 0.5 Mg Tabs (Clonazepam) .... Take 1 Tablet By Mouth Two Times A Day 4)  Zoloft 100 Mg Tabs (Sertraline Hcl) .... Take 1 Tablet By Mouth Once A Day 5)  Protonix 40 Mg Tbec (Pantoprazole Sodium) .... Take 1 Tablet By Mouth Once A Day 6)  Bayer Aspirin 81 7)  Toprol Xl 50 Mg Xr24h-Tab (Metoprolol Succinate) .... Once Daily 8)  Klor-Con 10 10 Meq Cr-Tabs (Potassium Chloride) .... One By Mouth Two Times A Day With Food 9)  Synthroid 100 Mcg Tabs (Levothyroxine Sodium) .... Take 1 By Mouth Qd  Allergies  (verified): 1)  ! Sulfa 2)  ! Erythromycin  Past History:  Past Medical History: Last updated: 06/08/2009 COPD DVT, hx of Hypertension Hypothyroidism Fatty liver disease  Review of Systems  The patient denies fever, hoarseness, chest pain, syncope, and hemoptysis.    Physical Exam  General:  alert, well-developed, well-nourished, and cooperative to examination.   overweight-appearing.   Eyes:  vision grossly intact; pupils equal, round and reactive to light.  conjunctiva and lids normal.    Ears:  normal pinnae bilaterally, without erythema, swelling, or tenderness to palpation. TMs clear, without effusion, or cerumen impaction. Hearing grossly normal bilaterally  Mouth:  teeth and gums in good repair; mucous membranes moist, without lesions or ulcers. oropharynx clear without exudate, mild erythema.  Lungs:  few rhonchi bilaterally, no w/c - no inc WOB-  Heart:  normal rate, regular rhythm, no murmur, and no rub. BLE without edema   Impression & Recommendations:  Problem # 1:  ACUTE BRONCHITIS (ICD-466.0)  no bronchospasm but +deep cough  - given COPD hx, will tx with abx to prevent further decompensation, no steroids needed today Her updated medication list for this problem includes:    Augmentin 875-125 Mg Tabs (Amoxicillin-pot clavulanate) .Marland Kitchen... 1 by mouth two times a day x 7 days    Tussionex Pennkinetic Er 8-10  Mg/79ml Lqcr (Chlorpheniramine-hydrocodone) .Marland KitchenMarland KitchenMarland KitchenMarland Kitchen 5 cc every 12 h by mouth as needed for severe cough  Take antibiotics and other medications as directed. Encouraged to push clear liquids, get enough rest, and take acetaminophen as needed. To be seen in 5-7 days if no improvement, sooner if worse.  Orders: Prescription Created Electronically [phone]) Tobacco use cessation intermediate 3-10 minutes ([address]406)  Problem # 2:  SMOKER (ICD-305.1)  5 minutes today spent on patient education regarding the unhealthy effects of continued tobacco abuse and encouragment of  cessation including medical options available to help patient to quit smoking.   Orders: Tobacco use cessation intermediate 3-10 minutes (99406)  Complete Medication List: 1)  Wellbutrin Xl 300 Mg Xr24h-tab (Bupropion hcl) .... Take 1 tablet by mouth once a day 2)  Hydrochlorothiazide 12.5 Mg Tabs (Hydrochlorothiazide) .... Take 1 tablet by mouth once a day 3)  Clonazepam 0.5 Mg Tabs (Clonazepam) .... Take 1 tablet by mouth two times a day 4)  Zoloft 100 Mg Tabs (Sertraline hcl) .... Take 1 tablet by mouth once a day 5)  Protonix 40 Mg Tbec (Pantoprazole sodium) .... Take 1 tablet by mouth once a day 6)  Bayer Aspirin 81  7)  Toprol Xl 50 Mg Xr24h-tab (Metoprolol succinate) .... Once daily 8)  Klor-con 10 10 Meq Cr-tabs (Potassium chloride) .... One by mouth two times a day with food 9)  Synthroid 100 Mcg Tabs (Levothyroxine sodium) .... Take 1 by mouth qd 10)  Augmentin 875-125 Mg Tabs (Amoxicillin-pot clavulanate) .Marland Kitchen.. 1 by mouth two times a day x 7 days 11)  Tussionex Pennkinetic Er 8-10 Mg/25ml Lqcr (Chlorpheniramine-hydrocodone) .... 5 cc every 12 h by mouth as needed for severe cough  Patient Instructions: 1)  Augmentin for your bronchitis and Tussionex+Mucinex for cough (antibiotic has been electronically submitted to your pharmacy) Please take as directed. Contact our office if you believe you're having problems with the medication(s).  2)  work excuse Mon-Thurs as discussed - 3)  Acute bronchitis symptoms : call if no improvment in  5-7 days, sooner if increasing cough, fever, or new symptoms (shortness of breath, chest pain). 4)  keep off the cigarettes even after you feel better!! stop smoking! Prescriptions: TUSSIONEX PENNKINETIC ER 8-10 MG/5ML LQCR (CHLORPHENIRAMINE-HYDROCODONE) 5 cc every 12 h by mouth as needed for severe cough  #60cc x 0   Entered and Authorized by:   Newt Lukes MD   Signed by:   Newt Lukes MD on 09/13/2009   Method used:   Print then Give to  Patient   RxID:   [id number] AUGMENTIN 875-125 MG TABS (AMOXICILLIN-POT CLAVULANATE) 1 by mouth two times a day x 7 days  #14 x 0   Entered and Authorized by:   Newt Lukes MD   Signed by:   Newt Lukes MD on 09/13/2009   Method used:   [facility]        [facility]       [address]       [place], [place]  [address]       Ph: [phone]       Fax: [phone]   RxID:   [id number]

## 2010-09-21 NOTE — Assessment & Plan Note (Signed)
Summary: gout, pain r foot/jones/cd   Vital Signs:  Patient profile:   60 year old female Height:      66 inches (167.64 cm) Weight:      269.8 pounds (122.64 kg) O2 Sat:      97 % on Room air Temp:     97.6 degrees F (36.44 degrees C) oral Pulse rate:   91 / minute BP sitting:   148 / 92  (left arm) Cuff size:   large  Vitals Entered By: Orlan Leavens (March 01, 2010 3:24 PM)  O2 Flow:  Room air CC: (R) foot pain (Gout) Is Patient Diabetic? No Pain Assessment Patient in pain? yes     Location: (R) foot Type: aching   Primary Care Provider:  Etta Grandchild MD  CC:  (R) foot pain (Gout).  History of Present Illness: c/o right foot pain - onset approx 3 months ago -  denies precipitating injury or fall - no hx same - no hx gout - no other joints affected pain located along top of mid foot and medial side from base of great toe to heel side of arch - has seen podiatry for same - given arch supports, xrays and labs done - told she had high uric acid levels and given 12d high dose pred - some improvement but not resolved pain - no redness or swelling - pain constant regardless if resting or walking  Clinical Review Panels:  CBC   WBC:  6.2 (10/27/2008)   RBC:  4.97 (10/27/2008)   Hgb:  14.9 (10/27/2008)   Hct:  43.2 (10/27/2008)   Platelets:  198 (10/27/2008)   MCV  86.9 (10/27/2008)   MCHC  34.6 (10/27/2008)   RDW  12.5 (10/27/2008)   PMN:  47.0 (10/27/2008)   Lymphs:  38.8 (10/27/2008)   Monos:  10.2 (10/27/2008)   Eosinophils:  3.6 (10/27/2008)   Basophil:  0.4 (10/27/2008)  Complete Metabolic Panel   Glucose:  83 (06/08/2009)   Sodium:  142 (06/08/2009)   Potassium:  3.1 (06/08/2009)   Chloride:  102 (06/08/2009)   CO2:  32 (06/08/2009)   BUN:  12 (06/08/2009)   Creatinine:  1.0 (06/08/2009)   Albumin:  3.8 (10/27/2008)   Total Protein:  7.3 (10/27/2008)   Calcium:  9.2 (06/08/2009)   Total Bili:  0.8 (10/27/2008)   Alk Phos:  77 (10/27/2008)  SGPT (ALT):  54 (10/27/2008)   SGOT (AST):  44 (10/27/2008)   Current Medications (verified): 1)  Wellbutrin Xl 300 Mg Xr24h-Tab (Bupropion Hcl) .... Take 1 Tablet By Mouth Once A Day 2)  Hydrochlorothiazide 12.5 Mg Tabs (Hydrochlorothiazide) .... Take 1 Tablet By Mouth Once A Day 3)  Clonazepam 0.5 Mg Tabs (Clonazepam) .... Take 1 Tablet By Mouth Two Times A Day 4)  Zoloft 100 Mg Tabs (Sertraline Hcl) .... Take 1 Tablet By Mouth Once A Day 5)  Protonix 40 Mg Tbec (Pantoprazole Sodium) .... Take 1 Tablet By Mouth Once A Day 6)  Bayer Aspirin 81 7)  Toprol Xl 50 Mg Xr24h-Tab (Metoprolol Succinate) .... Once Daily 8)  Klor-Con 10 10 Meq Cr-Tabs (Potassium Chloride) .... One By Mouth Two Times A Day With Food 9)  Synthroid 175 Mcg Tabs (Levothyroxine Sodium) .... One By Mouth Once Daily  Allergies (verified): 1)  ! Sulfa 2)  ! Erythromycin  Past History:  Past Medical History: COPD DVT, hx of Hypertension Hypothyroidism Fatty liver disease  MD roster: podiatry - petronitz  Review of Systems  The patient complains of difficulty walking.  The patient denies fever, weight loss, muscle weakness, and suspicious skin lesions.    Physical Exam  General:  alert, well-developed, well-nourished, and cooperative to examination.    Skin:  Intact without suspicious lesions or rashes   Foot/Ankle Exam  Gait:    antalgic.    Skin:    Intact with no scars, lesions, rashes, cafe-au-lait spots or bruising.    Inspection:    Inspection reveals no deformity, ecchymosis or swelling.   Palpation:    tenderness R-medial side above arch but not across sole of foot or heel   Vascular:    dorsalis pedis and posterior tibial pulses 2+ and symmetric, capillary refill < 2 seconds, normal hair pattern, no evidence of ischemia.   Foot Exam:    Right:    Inspection:  Normal    Palpation:  Normal    Stability:  stable    Tenderness:  yes    Swelling:  no    Erythema:  no    Range  of Motion:       Hallux MTP Dorsiflex-Active: 60       Hallux MTP Plantar Flex-Active: 45       Hallux IP-Active: full       Hallux MTP Dorsiflex-Passive: 60       Hallux MTP Plantar Flex-Passive: 45       Hallux IP-Passive: full    Left:    Inspection:  Normal    Palpation:  Normal    Stability:  stable    Tenderness:  no    Swelling:  no    Erythema:  no   Impression & Recommendations:  Problem # 1:  FOOT PAIN, RIGHT (ICD-729.5)  suspect tendonitis type strain or plantar fasc rather than gout given lack of typical joint symptoms despite reported high uric acid level - xrays done recently at podiatry - pt reports negative so will not repat here today - no hx injury or foreign body exposure - not improved symptoms despite high dose pred but will start allopurinol for uric acid levels - also use meloxicam for antiinflam pain  - rec eval by sports med specialist or PT or ortho - pt will consider - cont arch support and do not go barefoot  Orders: Prescription Created Electronically [phone])  Complete Medication List: 1)  Wellbutrin Xl 300 Mg Xr24h-tab (Bupropion hcl) .... Take 1 tablet by mouth once a day 2)  Hydrochlorothiazide 12.5 Mg Tabs (Hydrochlorothiazide) .... Take 1 tablet by mouth once a day 3)  Clonazepam 0.5 Mg Tabs (Clonazepam) .... Take 1 tablet by mouth two times a day 4)  Zoloft 100 Mg Tabs (Sertraline hcl) .... Take 1 tablet by mouth once a day 5)  Protonix 40 Mg Tbec (Pantoprazole sodium) .... Take 1 tablet by mouth once a day 6)  Bayer Aspirin 81  7)  Toprol Xl 50 Mg Xr24h-tab (Metoprolol succinate) .... Once daily 8)  Klor-con 10 10 Meq Cr-tabs (Potassium chloride) .... One by mouth two times a day with food 9)  Synthroid 175 Mcg Tabs (Levothyroxine sodium) .... One by mouth once daily 10)  Meloxicam 15 Mg Tabs (Meloxicam) .Marland Kitchen.. 1 by mouth once daily as needed for pain 11)  Allopurinol 300 Mg Tabs (Allopurinol) .Marland Kitchen.. 1 by mouth once daily  Patient  Instructions: 1)  it was good to see you today. 2)  start allopurinol for your uric acid levels and meloxicam for antiinflammatory pain - your prescriptions have been  electronically submitted to your pharmacy. Please take as directed. Contact our office if you believe you're having problems with the medication(s).  3)  let us know if you would like a referral to dr. Royal Hawthorn fields at family practice or dr. Lestine Box at [facility] as needed-  Prescriptions: ALLOPURINOL 300 MG TABS (ALLOPURINOL) 1 by mouth once daily  #30 x 2   Entered and Authorized by:   Newt Lukes MD   Signed by:   Newt Lukes MD on 03/01/2010   Method used:   [facility]        [facility]       [address]       [place], [place]  [address]       Ph: [phone]       Fax: [phone]   RxID:   [id number]

## 2010-09-21 NOTE — Letter (Signed)
Summary: Out of Work  [facility]  [address] [place], [place] [address]   Phone: [phone]  Fax: [phone]    September 16, 2009   Employee:  Sonia Dawson    To Whom It May Concern:   For Medical reasons, please excuse the above named employee from work for the following dates:  Start:   09/12/2009  End:   09/19/2009  If you need additional information, please feel free to contact our office.         Sincerely,    Alvy Beal A CMA for Dr. Sanda Linger

## 2010-09-21 NOTE — Assessment & Plan Note (Signed)
Summary: nausea/equilibrum off/#/cd   Vital Signs:  Patient profile:   60 year old female Menstrual status:  postmenopausal Height:      66 inches Weight:      265 pounds BMI:     42.93 O2 Sat:      94 % on Room air Temp:     97.9 degrees F oral Pulse rate:   99 / minute Pulse rhythm:   regular Resp:     16 per minute BP sitting:   140 / 82  (left arm) Cuff size:   large  Vitals Entered By: Rock Nephew CMA (August 31, 2010 2:55 PM)  Nutrition Counseling: Patient's BMI is greater than 25 and therefore counseled on weight management options.  O2 Flow:  Room air  Primary Care Provider:  Etta Grandchild MD   History of Present Illness: She returns and tells me that she has been feeling poorly for 3 days with nausea, dizziness, and vertigo. She has had mild URI symptoms but no cough ,fever, or vomiting.  Preventive Screening-Counseling & Management  Alcohol-Tobacco     Alcohol drinks/day: 0     Alcohol Counseling: not indicated; patient does not drink     Smoking Status: current     Smoking Cessation Counseling: yes     Smoke Cessation Stage: precontemplative     Packs/Day: 0.5     Pack years: 20     Tobacco Counseling: to quit use of tobacco products  Hep-HIV-STD-Contraception     Hepatitis Risk: no risk noted     HIV Risk: no risk noted     STD Risk: no risk noted      Sexual History:  currently monogamous.        Drug Use:  never.        Blood Transfusions:  no.    Clinical Review Panels:  Diabetes Management   HgBA1C:  6.0 (12/31/2008)   Creatinine:  1.14 (04/04/2010)  CBC   WBC:  7.9 (04/04/2010)   RBC:  5.01 (04/04/2010)   Hgb:  14.9 (04/04/2010)   Hct:  42.5 (04/04/2010)   Platelets:  223 (04/04/2010)   MCV  85 (04/04/2010)   MCHC  34.6 (10/27/2008)   RDW  14.7 (04/04/2010)   PMN:  52 (04/04/2010)   Lymphs:  38.8 (10/27/2008)   Monos:  9 (04/04/2010)   Eosinophils:  2 (04/04/2010)   Basophil:  0 (04/04/2010)  Complete Metabolic Panel  Glucose:  83 (04/54/0981)   Sodium:  138 (04/04/2010)   Potassium:  4.2 (04/04/2010)   Chloride:  101 (04/04/2010)   CO2:  26 (04/04/2010)   BUN:  14 (04/04/2010)   Creatinine:  1.14 (04/04/2010)   Albumin:  4.3 (04/04/2010)   Total Protein:  7.2 (04/04/2010)   Calcium:  9.6 (04/04/2010)   Total Bili:  0.8 (10/27/2008)   Alk Phos:  74 (04/04/2010)   SGPT (ALT):  53 (04/04/2010)   SGOT (AST):  48 (04/04/2010)   Medications Prior to Update: 1)  Wellbutrin Xl 300 Mg Xr24h-Tab (Bupropion Hcl) .... Take 1 Tablet By Mouth Once A Day 2)  Hydrochlorothiazide 12.5 Mg Tabs (Hydrochlorothiazide) .... Take 1 Tablet By Mouth Once A Day 3)  Clonazepam 0.5 Mg Tabs (Clonazepam) .... Take 1 Tablet By Mouth Two Times A Day 4)  Zoloft 100 Mg Tabs (Sertraline Hcl) .... Take 1 Tablet By Mouth Once A Day 5)  Protonix 40 Mg Tbec (Pantoprazole Sodium) .... Take 1 Tablet By Mouth Once A Day 6)  Bayer  Aspirin 81 7)  Toprol Xl 50 Mg Xr24h-Tab (Metoprolol Succinate) .... Once Daily 8)  Klor-Con 10 10 Meq Cr-Tabs (Potassium Chloride) .... One By Mouth Two Times A Day With Food 9)  Synthroid 175 Mcg Tabs (Levothyroxine Sodium) .... One By Mouth Once Daily 10)  Allopurinol 300 Mg Tabs (Allopurinol) .Marland Kitchen.. 1 By Mouth Once Daily 11)  Pennsaid 1.5 % Soln (Diclofenac Sodium) .... Apply To Aa Three Times A Day As Directed 12)  Tramadol Hcl 50 Mg Tabs (Tramadol Hcl) .Marland Kitchen.. 1-2 By Mouth Qid As Needed For Foot Pain  Current Medications (verified): 1)  Wellbutrin Xl 300 Mg Xr24h-Tab (Bupropion Hcl) .... Take 1 Tablet By Mouth Once A Day 2)  Hydrochlorothiazide 25 Mg Tabs (Hydrochlorothiazide) .... Take 1 Tablet By Mouth Once A Day 3)  Clonazepam 0.5 Mg Tabs (Clonazepam) .... Take 1 Tablet By Mouth Two Times A Day 4)  Zoloft 100 Mg Tabs (Sertraline Hcl) .... Take 1 Tablet By Mouth Once A Day 5)  Protonix 40 Mg Tbec (Pantoprazole Sodium) .... Take 1 Tablet By Mouth Once A Day 6)  Bayer Aspirin 81 7)  Toprol Xl 50 Mg  Xr24h-Tab (Metoprolol Succinate) .... Once Daily 8)  Klor-Con 10 10 Meq Cr-Tabs (Potassium Chloride) .... One By Mouth Two Times A Day With Food 9)  Synthroid 175 Mcg Tabs (Levothyroxine Sodium) .... One By Mouth Once Daily 10)  Allopurinol 300 Mg Tabs (Allopurinol) .Marland Kitchen.. 1 By Mouth Once Daily As Needed 11)  Pennsaid 1.5 % Soln (Diclofenac Sodium) .... Apply To Aa Three Times A Day As Directed 12)  Tramadol Hcl 50 Mg Tabs (Tramadol Hcl) .Marland Kitchen.. 1-2 By Mouth Qid As Needed For Foot Pain 13)  Meclizine Hcl 12.5 Mg Tabs (Meclizine Hcl) .Marland Kitchen.. 1-2 By Mouth Qid As Needed For Dizziness  Allergies (verified): 1)  ! Sulfa 2)  ! 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 Family History of Arthritis  Social History: Reviewed history from 12/31/2008 and no changes required. 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  The patient denies anorexia, fever, weight loss, vision loss, decreased hearing, hoarseness, chest pain, syncope, dyspnea on  exertion, peripheral edema, prolonged cough, headaches, hemoptysis, abdominal pain, muscle weakness, suspicious skin lesions, transient blindness, difficulty walking, and enlarged lymph nodes.   ENT:  Complains of earache, nasal congestion, and postnasal drainage; denies decreased hearing, difficulty swallowing, ear discharge, hoarseness, nosebleeds, ringing in ears, sinus pressure, and sore throat. Neuro:  Complains of sensation of room spinning; denies brief paralysis, difficulty with concentration, disturbances in coordination, falling down, headaches, inability to speak, memory loss, numbness, poor balance, seizures, tingling, tremors, visual disturbances, and weakness. Psych:  Complains of anxiety, easily angered, easily tearful, and irritability; denies  depression, mental problems, panic attacks, sense of great danger, suicidal thoughts/plans, thoughts of violence, and unusual visions or sounds.  Physical Exam  General:  alert, well-developed, well-nourished, and cooperative to examination.   overweight-appearing.   Head:  normocephalic, atraumatic, no abnormalities observed, and no abnormalities palpated.   Eyes:  vision grossly intact, pupils equal, pupils round, pupils reactive to light, pupils react to accomodation, no injection, no retinal abnormalitiies, and no nystagmus.   Ears:  R ear normal and L ear normal.   Nose:  External nasal examination shows no deformity or inflammation. Nasal mucosa are pink and moist without lesions or exudates. Mouth:  Oral mucosa and oropharynx without lesions or exudates.  Teeth in good repair. Neck:  supple, full ROM, no masses, no thyromegaly, and no neck tenderness.   Lungs:  normal respiratory effort, no intercostal retractions, no accessory muscle use, normal breath sounds, no dullness, no crackles, and no wheezes.   Heart:  normal rate, regular rhythm, no murmur, no gallop, no rub, and no JVD.   Abdomen:  soft, non-tender, normal bowel sounds, no  distention, no masses, no guarding, no rigidity, no rebound tenderness, no abdominal hernia, no hepatomegaly, and no splenomegaly.   Msk:  normal ROM, no joint tenderness, no joint swelling, no joint warmth, no redness over joints, no joint deformities, no joint instability, no crepitation, and no muscle atrophy.   Pulses:  R and L carotid,radial,femoral,dorsalis pedis and posterior tibial pulses are full and equal bilaterally Extremities:  No clubbing, cyanosis, edema, or deformity noted with normal full range of motion of all joints.   Neurologic:  alert & oriented X3, cranial nerves II-XII intact, strength normal in all extremities, sensation intact to light touch, sensation intact to pinprick, gait normal, DTRs symmetrical and normal, finger-to-nose normal, heel-to-shin normal, toes down bilaterally on Babinski, and Romberg negative.   Skin:  turgor normal, color normal, no rashes, no suspicious lesions, no ecchymoses, no petechiae, no purpura, no ulcerations, and no edema.   Cervical Nodes:  no anterior cervical adenopathy and no posterior cervical adenopathy.   Axillary Nodes:  no R axillary adenopathy and no L axillary adenopathy.   Inguinal Nodes:  no R inguinal adenopathy and no L inguinal adenopathy.   Psych:  Cognition and judgment appear intact. Alert and cooperative with normal attention span and concentration. 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   Problem # 3:  HYPERTENSION (ICD-401.9) Assessment: Unchanged  Her updated medication list for this problem includes:    Hydrochlorothiazide 25 Mg Tabs  (Hydrochlorothiazide) .Marland Kitchen... Take 1 tablet by mouth once a day    Toprol Xl 50 Mg Xr24h-tab (Metoprolol succinate) ..... Once daily  BP today: 140/82 Prior BP: 132/80 (05/29/2010)  Prior 10 Yr Risk Heart Disease: Not enough information (02/22/2009)  Labs Reviewed: K+: 4.2 (04/04/2010) Creat: : 1.14 (04/04/2010)     Complete Medication List: 1)  Wellbutrin Xl 300 Mg Xr24h-tab (Bupropion hcl) .... Take 1 tablet by mouth once a day 2)  Hydrochlorothiazide 25 Mg Tabs (Hydrochlorothiazide) .... Take 1 tablet by mouth once a day 3)  Clonazepam 0.5 Mg Tabs (Clonazepam) .... Take 1 tablet by mouth two times a day 4)  Zoloft 100 Mg Tabs (Sertraline hcl) .... Take 1 tablet by mouth once a day 5)  Protonix 40 Mg Tbec (Pantoprazole sodium) .... Take 1 tablet by mouth once a day 6)  Bayer Aspirin 81  7)  Toprol Xl 50 Mg Xr24h-tab (Metoprolol succinate) .... Once daily 8)  Klor-con 10 10 Meq Cr-tabs (Potassium chloride) .... One by mouth two times a day with food 9)  Synthroid 175 Mcg Tabs (Levothyroxine sodium) .... One by mouth once daily 10)  Allopurinol 300 Mg Tabs (Allopurinol) .Marland Kitchen.. 1 by mouth once daily as needed 11)  Pennsaid 1.5 % Soln (Diclofenac sodium) .... Apply to aa three times a day as directed 12)  Tramadol Hcl 50 Mg Tabs (Tramadol hcl) .Marland Kitchen.. 1-2 by mouth qid as needed for foot pain 13)  Meclizine Hcl 12.5 Mg Tabs (Meclizine hcl) .Marland Kitchen.. 1-2 by mouth qid as needed for dizziness  Patient Instructions: 1)  Please schedule a follow-up appointment in 2 weeks. 2)  Tobacco is very bad for your health and your loved ones! You Should stop smoking!. 3)  Stop Smoking Tips: Choose a Quit date. Cut down before the Quit date. decide what you will do as a substitute when you feel the urge to smoke(gum,toothpick,exercise). 4)  It is important that you exercise regularly at least 20 minutes 5 times a week. If you develop chest pain, have severe difficulty breathing, or feel very tired , stop exercising  immediately and seek medical attention. 5)  You need to lose weight. Consider a lower calorie diet and regular exercise.  6)  Check your Blood Pressure regularly. If it is above 130/80: you should make an appointment. Prescriptions: MECLIZINE HCL 12.5 MG TABS (MECLIZINE HCL) 1-2 by mouth QID as needed for dizziness  #35 x 1   Entered and Authorized by:   Etta Grandchild MD   Signed by:   Etta Grandchild MD on 08/31/2010   Method used:   Electronically to        [facility] (retail)       [address]       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 Patient Level IV [04540]

## 2010-09-21 NOTE — Assessment & Plan Note (Signed)
Summary: FEET PAIN---STC   Vital Signs:  Patient profile:   60 year old female Height:      66 inches Weight:      269.8 pounds BMI:     43.70 O2 Sat:      95 % on Room air Temp:     97.6 degrees F oral Pulse rate:   68 / minute Pulse rhythm:   regular Resp:     16 per minute BP sitting:   132 / 80  (left arm) Cuff size:   large  Vitals Entered By: Rock Nephew CMA (May 29, 2010 10:19 AM)  Nutrition Counseling: Patient's BMI is greater than 25 and therefore counseled on weight management options.  O2 Flow:  Room air CC: Bilateral foot pain and leg swelling, Hypertension Management   Primary Care Shelbey Spindler:  Etta Grandchild MD  CC:  Bilateral foot pain and leg swelling and Hypertension Management.  History of Present Illness: She returns c/o foot pain for about 1-2 years, right more than left. She was seen at [facility] but she does not know what the diagnosis was, she was given an Rx for Meloxicam but she states that she lost it and has not been taking any nsaids lately. She had a busted varicose on her left lower leg several weeks and she was seen by ? vascular surgeon last week and she is considering sclerotherapy.  Hypertension History:      She denies headache, chest pain, palpitations, dyspnea with exertion, orthopnea, PND, peripheral edema, visual symptoms, neurologic problems, syncope, and side effects from treatment.  She notes no problems with any antihypertensive medication side effects.        Positive major cardiovascular risk factors include female age 66 years old or older and hypertension.  Negative major cardiovascular risk factors include no history of diabetes or hyperlipidemia, negative family history for ischemic heart disease, and non-tobacco-user status.        Further assessment for target organ damage reveals no history of ASHD, cardiac end-organ damage (CHF/LVH), stroke/TIA, peripheral vascular disease, renal insufficiency, or hypertensive  retinopathy.     Preventive Screening-Counseling & Management  Alcohol-Tobacco     Smoking Cessation Counseling: yes  -  Date:  04/04/2010    NA 138    K 4.2    CREAT 1.14    GLU 122    SGOT 48    SGPT 53    ALK PHOS 74    HGB 14.9    PLTS 223    MCV 85    MCV 85    TSH 0.495    WBC: 7.9    HCT: 42.5    RBC: 5.01    MCV: 85    MCV: 85    RDW: 14.7    Neutrophil: 52    Lymphs: 37    Monos: 9    Eos: 2    Basophil: 0    BUN: 14    Chloride: 101    CO2 Total: 26    Calcium: 9.6    Total Protein: 7.2    Albumin: 4.3    GFR(Non African American): 53    Uric Acid: 8.5  Current Medications (verified): 1)  Wellbutrin Xl 300 Mg Xr24h-Tab (Bupropion Hcl) .... Take 1 Tablet By Mouth Once A Day 2)  Hydrochlorothiazide 12.5 Mg Tabs (Hydrochlorothiazide) .... Take 1 Tablet By Mouth Once A Day 3)  Clonazepam 0.5 Mg Tabs (Clonazepam) .... Take 1 Tablet By Mouth Two Times A  Day 4)  Zoloft 100 Mg Tabs (Sertraline Hcl) .... Take 1 Tablet By Mouth Once A Day 5)  Protonix 40 Mg Tbec (Pantoprazole Sodium) .... Take 1 Tablet By Mouth Once A Day 6)  Bayer Aspirin 81 7)  Toprol Xl 50 Mg Xr24h-Tab (Metoprolol Succinate) .... Once Daily 8)  Klor-Con 10 10 Meq Cr-Tabs (Potassium Chloride) .... One By Mouth Two Times A Day With Food 9)  Synthroid 175 Mcg Tabs (Levothyroxine Sodium) .... One By Mouth Once Daily 10)  Allopurinol 300 Mg Tabs (Allopurinol) .Marland Kitchen.. 1 By Mouth Once Daily 11)  Pennsaid 1.5 % Soln (Diclofenac Sodium) .... Apply To Aa Three Times A Day As Directed 12)  Tramadol Hcl 50 Mg Tabs (Tramadol Hcl) .Marland Kitchen.. 1-2 By Mouth Qid As Needed For Foot Pain  Allergies (verified): 1)  ! Sulfa 2)  ! 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 Family History of Arthritis  Social History: Reviewed history from 12/31/2008 and no changes required. 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  The patient denies anorexia, fever, chest pain, syncope, dyspnea on exertion, peripheral edema, prolonged cough, headaches, hemoptysis, abdominal pain, depression, enlarged lymph nodes, and angioedema.   CV:  Denies chest pain or discomfort, difficulty breathing at night, fainting, fatigue, leg cramps with exertion, lightheadness, near fainting, palpitations, shortness of breath with exertion, swelling of feet, and swelling of hands.  Physical Exam  General:  alert, well-developed, well-nourished, and cooperative to examination.   overweight-appearing.   Mouth:  teeth and gums in good repair; mucous membranes moist, without lesions or ulcers. oropharynx clear without exudate, mild erythema.  Neck:  supple, full ROM, no masses, no thyromegaly, and no neck tenderness.   Lungs:  normal respiratory effort, no intercostal retractions, no accessory muscle use, normal breath sounds, no dullness, no crackles, and no wheezes.   Heart:  normal rate, regular rhythm, no murmur, no gallop, no rub, and no JVD.   Abdomen:  soft, non-tender, normal bowel sounds, no distention, no masses, no guarding, no rigidity, no rebound tenderness, no abdominal hernia, no hepatomegaly, and no splenomegaly.   Msk:  normal  ROM, no joint tenderness, no joint swelling, no joint warmth, no redness over joints, no joint deformities, no joint instability, no crepitation, and no muscle atrophy.   Pulses:  R and L carotid,radial,femoral,dorsalis pedis and posterior tibial pulses are full and equal bilaterally Extremities:  No clubbing, cyanosis, edema, or deformity noted with normal full range of motion of all joints.   Neurologic:  No cranial nerve deficits noted. Station and gait are normal. Plantar reflexes are down-going bilaterally. DTRs are symmetrical throughout. Sensory, motor and coordinative functions appear intact. Skin:  Intact without suspicious lesions or rashes Cervical Nodes:  no anterior cervical adenopathy and no posterior cervical adenopathy.   Axillary Nodes:  no R axillary adenopathy and no L axillary adenopathy.  Inguinal Nodes:  no R inguinal adenopathy and no L inguinal adenopathy.   Psych:  Cognition and judgment appear intact. Alert and cooperative with normal attention span and concentration. 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 1 by mouth once daily as needed for pain Her updated medication list for this problem includes:    Tramadol Hcl 50 Mg Tabs (Tramadol hcl) .Marland Kitchen... 1-2 by mouth qid as needed for foot pain  Problem # 3:  HYPERTENSION (ICD-401.9) Assessment: Improved  Her updated medication list for this problem includes:    Hydrochlorothiazide 12.5 Mg Tabs (Hydrochlorothiazide) .Marland Kitchen... Take 1 tablet by mouth once a day    Toprol Xl 50 Mg Xr24h-tab (Metoprolol succinate) ..... Once daily  BP today:  132/80 Prior BP: 148/92 (03/01/2010)  Prior 10 Yr Risk Heart Disease: Not enough information (02/22/2009)  Labs Reviewed: K+: 4.2 (04/04/2010) Creat: : 1.14 (04/04/2010)     Complete Medication List: 1)  Wellbutrin Xl 300 Mg Xr24h-tab (Bupropion hcl) .... Take 1 tablet by mouth once a day 2)  Hydrochlorothiazide 12.5 Mg Tabs (Hydrochlorothiazide) .... Take 1 tablet by mouth once a day 3)  Clonazepam 0.5 Mg Tabs (Clonazepam) .... Take 1 tablet by mouth two times a day 4)  Zoloft 100 Mg Tabs (Sertraline hcl) .... Take 1 tablet by mouth once a day 5)  Protonix 40 Mg Tbec (Pantoprazole sodium) .... Take 1 tablet by mouth once a day 6)  Bayer Aspirin 81  7)  Toprol Xl 50 Mg Xr24h-tab (Metoprolol succinate) .... Once daily 8)  Klor-con 10 10 Meq Cr-tabs (Potassium chloride) .... One by mouth two times a day with food 9)  Synthroid 175 Mcg Tabs (Levothyroxine sodium) .... One by mouth once daily 10)  Allopurinol 300 Mg Tabs (Allopurinol) .Marland Kitchen.. 1 by mouth once daily 11)  Pennsaid 1.5 % Soln (Diclofenac sodium) .... Apply to aa three times a day as directed 12)  Tramadol Hcl 50 Mg Tabs (Tramadol hcl) .Marland Kitchen.. 1-2 by mouth qid as needed for foot pain  Hypertension Assessment/Plan:      The patient's hypertensive risk group is category B: At least one risk factor (excluding diabetes) with no target organ damage.  Today's blood pressure is 132/80.  Her blood pressure goal is < 140/90.  Patient Instructions: 1)  Please schedule a follow-up appointment in 2 months. 2)  Tobacco is very bad for your health and your loved ones! You Should stop smoking!. 3)  Stop Smoking Tips: Choose a Quit date. Cut down before the Quit date. decide what you will do as a substitute when you feel the urge to smoke(gum,toothpick,exercise). 4)  It is important that you exercise regularly at least 20 minutes 5 times a week. If you develop chest pain, have severe difficulty breathing, or feel very tired , stop exercising  immediately and seek medical attention. 5)  You need to lose weight. Consider a lower calorie diet and regular exercise.  6)  Take 650-1000mg  of Tylenol every 4-6 hours as needed for relief of pain or comfort of fever AVOID taking more than 4000mg   in a 24 hour period (can cause liver damage in higher doses). Prescriptions: TRAMADOL HCL  50 MG TABS (TRAMADOL HCL) 1-2 by mouth QID as needed for foot pain  #100 x 11   Entered and Authorized by:   Etta Grandchild MD   Signed by:   Etta Grandchild MD on 05/29/2010   Method used:   Electronically to        [facility] (retail)       [address]       [place], [place]  [address]       Ph: [phone]       Fax: [phone]   RxID:   [id number] PENNSAID 1.5 % SOLN (DICLOFENAC SODIUM) Apply to AA three times a day as directed  #3 bottles x 0   Entered and Authorized by:   Etta Grandchild MD   Signed by:   Etta Grandchild MD on 05/29/2010   Method used:   Samples Given   RxID:   [id number]

## 2010-09-21 NOTE — Assessment & Plan Note (Signed)
Summary: 2 WEEK FOLLOW UP--SS   Vital Signs:  Patient profile:   60 year old female Menstrual status:  postmenopausal Height:      66 inches Weight:      264 pounds BMI:     42.76 O2 Sat:      98 % on Room air Temp:     97.4 degrees F oral Pulse rate:   63 / minute Pulse rhythm:   regular Resp:     16 per minute BP sitting:   124 / 82  (left arm) Cuff size:   large  Vitals Entered By: Rock Nephew CMA (September 14, 2010 8:22 AM)  Nutrition Counseling: Patient's BMI is greater than 25 and therefore counseled on weight management options.  O2 Flow:  Room air  Primary Care Provider:  Etta Grandchild MD   History of Present Illness:  Follow-Up Visit      This is a 60 year old woman who presents for Follow-up visit.  The patient denies chest pain, palpitations, dizziness, syncope, low blood sugar symptoms, high blood sugar symptoms, edema, SOB, DOE, PND, and orthopnea.  Since the last visit the patient notes no new problems or concerns.  The patient reports taking meds as prescribed and dietary noncompliance.  When questioned about possible medication side effects, the patient notes none.    Preventive Screening-Counseling & Management  Alcohol-Tobacco     Alcohol drinks/day: 0     Alcohol Counseling: not indicated; patient does not drink     Smoking Status: current     Smoking Cessation Counseling: yes     Smoke Cessation Stage: precontemplative     Packs/Day: 0.5     Pack years: 20     Tobacco Counseling: to quit use of tobacco products  Hep-HIV-STD-Contraception     Hepatitis Risk: no risk noted     HIV Risk: no risk noted     STD Risk: no risk noted      Sexual History:  currently monogamous.        Drug Use:  never.        Blood Transfusions:  no.    Clinical Review Panels:  Diabetes Management   HgBA1C:  6.0 (12/31/2008)   Creatinine:  0.98 (09/12/2010)  CBC   WBC:  7.4 (09/12/2010)   RBC:  5.30 (09/12/2010)   Hgb:  14.6 (09/12/2010)   Hct:  42.6  (09/12/2010)   Platelets:  241 (09/12/2010)   MCV  80 (09/12/2010)   MCHC  34.6 (10/27/2008)   RDW  15.6 (09/12/2010)   PMN:  53 (09/12/2010)   Lymphs:  38.8 (10/27/2008)   Monos:  9 (09/12/2010)   Eosinophils:  0.1 (09/12/2010)   Basophil:  0 (09/12/2010)  Complete Metabolic Panel   Glucose:  105 (09/12/2010)   Sodium:  140 (09/12/2010)   Potassium:  4.3 (09/12/2010)   Chloride:  103 (09/12/2010)   CO2:  32 (09/12/2010)   BUN:  16 (09/12/2010)   Creatinine:  0.98 (09/12/2010)   Albumin:  4.3 (09/12/2010)   Total Protein:  7.4 (09/12/2010)   Calcium:  9.6 (09/12/2010)   Total Bili:  0.4 (09/12/2010)   Alk Phos:  86 (09/12/2010)   SGPT (ALT):  43 (09/12/2010)   SGOT (AST):  42 (09/12/2010)   -  Date:  09/12/2010    WBC: 7.4    HGB: 14.6    HCT: 42.6    RBC: 5.30    PLT: 241    MCV: 80  RDW: 15.6    Neutrophil: 53    Lymphs: 36    Monos: 9    Eos: 0.1    Basophil: 0    CBC Comments: MCH-27.5, MCHC-34.3    BG Random: 105    BUN: 16    Creatinine: 0.98    Sodium: 140    Potassium: 4.3    Chloride: 103    CO2 Total: 32    SGOT (AST): 42    SGPT (ALT): 43    T. Bilirubin: 0.4    Alk Phos: 86    Calcium: 9.6    Total Protein: 7.4    Albumin: 4.3    GFR(Non African American): 64    GFR(African American) 74    CMP Comment: uric acid-7.6, A/G Ratio 1.4    TSH: 0.325  Medications Prior to Update: 1)  Wellbutrin Xl 300 Mg Xr24h-Tab (Bupropion Hcl) .... Take 1 Tablet By Mouth Once A Day 2)  Hydrochlorothiazide 25 Mg Tabs (Hydrochlorothiazide) .... Take 1 Tablet By Mouth Once A Day 3)  Clonazepam 0.5 Mg Tabs (Clonazepam) .... Take 1 Tablet By Mouth Two Times A Day 4)  Zoloft 100 Mg Tabs (Sertraline Hcl) .... Take 1 Tablet By Mouth Once A Day 5)  Protonix 40 Mg Tbec (Pantoprazole Sodium) .... Take 1 Tablet By Mouth Once A Day 6)  Bayer Aspirin 81 7)  Toprol Xl 50 Mg Xr24h-Tab (Metoprolol Succinate) .... Once Daily 8)  Klor-Con 10 10 Meq Cr-Tabs (Potassium  Chloride) .... One By Mouth Two Times A Day With Food 9)  Synthroid 175 Mcg Tabs (Levothyroxine Sodium) .... One By Mouth Once Daily 10)  Allopurinol 300 Mg Tabs (Allopurinol) .Marland Kitchen.. 1 By Mouth Once Daily As Needed 11)  Pennsaid 1.5 % Soln (Diclofenac Sodium) .... Apply To Aa Three Times A Day As Directed 12)  Tramadol Hcl 50 Mg Tabs (Tramadol Hcl) .Marland Kitchen.. 1-2 By Mouth Qid As Needed For Foot Pain 13)  Meclizine Hcl 12.5 Mg Tabs (Meclizine Hcl) .Marland Kitchen.. 1-2 By Mouth Qid As Needed For Dizziness  Current Medications (verified): 1)  Wellbutrin Xl 300 Mg Xr24h-Tab (Bupropion Hcl) .... Take 1 Tablet By Mouth Once A Day 2)  Hydrochlorothiazide 25 Mg Tabs (Hydrochlorothiazide) .... Take 1 Tablet By Mouth Once A Day 3)  Clonazepam 0.5 Mg Tabs (Clonazepam) .... Take 1 Tablet By Mouth Two Times A Day 4)  Zoloft 100 Mg Tabs (Sertraline Hcl) .... Take 1 Tablet By Mouth Once A Day 5)  Protonix 40 Mg Tbec (Pantoprazole Sodium) .... Take 1 Tablet By Mouth Once A Day 6)  Bayer Aspirin 81 7)  Toprol Xl 50 Mg Xr24h-Tab (Metoprolol Succinate) .... Once Daily 8)  Klor-Con 10 10 Meq Cr-Tabs (Potassium Chloride) .... One By Mouth Two Times A Day With Food 9)  Allopurinol 300 Mg Tabs (Allopurinol) .Marland Kitchen.. 1 By Mouth Once Daily As Needed 10)  Pennsaid 1.5 % Soln (Diclofenac Sodium) .... Apply To Aa Three Times A Day As Directed 11)  Tramadol Hcl 50 Mg Tabs (Tramadol Hcl) .Marland Kitchen.. 1-2 By Mouth Qid As Needed For Foot Pain 12)  Meclizine Hcl 12.5 Mg Tabs (Meclizine Hcl) .Marland Kitchen.. 1-2 By Mouth Qid As Needed For Dizziness 13)  Synthroid 150 Mcg Tabs (Levothyroxine Sodium) .... One By Mouth Once Daily  Allergies (verified): 1)  ! Sulfa 2)  ! 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 Family History of Arthritis  Social History: Reviewed history from 08/31/2010 and no changes required. 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  The patient denies anorexia, fever, weight loss, chest pain, syncope, dyspnea on exertion, peripheral edema, prolonged cough, headaches, hemoptysis, abdominal pain, hematuria, suspicious skin lesions, difficulty walking, depression, abnormal bleeding, enlarged lymph nodes, and angioedema.   GI:  Denies abdominal pain, change in bowel habits, constipation, diarrhea, excessive appetite, indigestion, loss of appetite, nausea, vomiting, vomiting blood, and yellowish skin color.  Physical Exam  General:  alert, well-developed, well-nourished, and cooperative to examination.   overweight-appearing.   Head:  normocephalic, atraumatic, no abnormalities observed, and no abnormalities palpated.   Mouth:  Oral mucosa and oropharynx without lesions or exudates.  Teeth in good repair. Neck:  supple, full ROM, no masses, no thyromegaly, and no neck tenderness.   Lungs:  normal respiratory effort, no intercostal retractions, no accessory muscle use, normal breath sounds, no dullness, no crackles, and no wheezes.   Heart:  normal rate, regular rhythm, no murmur, no gallop, no rub, and no JVD.   Abdomen:  soft, non-tender, normal bowel sounds, no distention, no masses, no guarding,  no rigidity, no rebound tenderness, no abdominal hernia, no hepatomegaly, and no splenomegaly.   Msk:  normal ROM, no joint tenderness, no joint swelling, no joint warmth, no redness over joints, no joint deformities, no joint instability, no crepitation, and no muscle atrophy.   Pulses:  R and L carotid,radial,femoral,dorsalis pedis and posterior tibial pulses are full and equal bilaterally Extremities:  No clubbing, cyanosis, edema, or deformity noted with normal full range of motion of all joints.   Neurologic:  alert & oriented X3, cranial nerves II-XII intact, strength normal in all extremities, sensation intact to light touch, sensation intact to pinprick, gait normal, and DTRs symmetrical and normal.   Skin:  turgor normal, color normal, no rashes, no suspicious lesions, no ecchymoses, no petechiae, no purpura, no ulcerations, and no edema.   Cervical Nodes:  no anterior cervical adenopathy and no posterior cervical adenopathy.   Psych:  Cognition and judgment appear intact. Alert and cooperative with normal attention span and concentration. No apparent delusions, illusions, hallucinations   Impression & Recommendations:  Problem # 1:  FATTY LIVER DISEASE (ICD-571.8) Assessment New  Problem # 2:  TRANSAMINASES, SERUM, ELEVATED (ICD-790.4) Assessment: Unchanged  Problem # 3:  VITAMIN D DEFICIENCY (ICD-268.9) Assessment: Unchanged  Problem # 4:  HYPOTHYROIDISM (ICD-244.9) Assessment: Deteriorated  The following medications were removed from the medication list:    Synthroid 175 Mcg Tabs (Levothyroxine sodium) ..... One by mouth once daily Her updated medication list for this problem includes:    Synthroid 150 Mcg Tabs (Levothyroxine sodium) ..... One by mouth once daily  Labs Reviewed: TSH: 0.325 (09/12/2010)   Total T4: 10.2 (10/27/2008)    HgBA1c: 6.0 (12/31/2008)  Problem # 5:  HYPERGLYCEMIA, BORDERLINE (ICD-790.29) Assessment: Unchanged  Labs Reviewed: Creat: 0.98  (09/12/2010)  Problem # 6:  HYPERTENSION (ICD-401.9) Assessment: Improved  Her updated medication list for this problem includes:    Hydrochlorothiazide 25 Mg Tabs (Hydrochlorothiazide) .Marland Kitchen... Take 1 tablet by mouth once a day    Toprol Xl 50 Mg Xr24h-tab (Metoprolol succinate) ..... Once daily  BP today: 124/82 Prior BP: 140/82 (08/31/2010)  Prior 10 Yr Risk Heart Disease: Not enough information (02/22/2009)  Labs Reviewed: K+: 4.3 (09/12/2010) Creat: : 0.98 (09/12/2010)     Complete Medication List: 1)  Wellbutrin Xl 300 Mg Xr24h-tab (Bupropion hcl) .... Take 1 tablet by mouth once a day 2)  Hydrochlorothiazide 25 Mg Tabs (Hydrochlorothiazide) .... Take 1 tablet by mouth once a day 3)  Clonazepam 0.5 Mg Tabs (Clonazepam) .... Take 1 tablet by mouth two times a day 4)  Zoloft 100 Mg Tabs (Sertraline hcl) .... Take 1 tablet by mouth once a day 5)  Protonix 40 Mg Tbec (Pantoprazole sodium) .... Take 1 tablet by mouth once a day 6)  Bayer Aspirin 81  7)  Toprol Xl 50 Mg Xr24h-tab (Metoprolol succinate) .... Once daily 8)  Klor-con 10 10 Meq Cr-tabs (Potassium chloride) .... One by mouth two times a day with food 9)  Allopurinol 300 Mg Tabs (Allopurinol) .Marland Kitchen.. 1 by mouth once daily as needed 10)  Pennsaid 1.5 % Soln (Diclofenac sodium) .... Apply to aa three times a day as directed 11)  Tramadol Hcl 50 Mg Tabs (Tramadol hcl) .Marland Kitchen.. 1-2 by mouth qid as needed for foot pain 12)  Meclizine Hcl 12.5 Mg Tabs (Meclizine hcl) .Marland Kitchen.. 1-2 by mouth qid as needed for dizziness 13)  Synthroid 150 Mcg Tabs (Levothyroxine sodium) .... One by mouth once daily  Patient Instructions: 1)  Please schedule a follow-up appointment in 3 months. 2)  Tobacco is very bad for your health and your loved ones! You Should stop smoking!. 3)  Stop Smoking Tips: Choose a Quit date. Cut down before the Quit date. decide what you will do as a substitute when you feel the urge to smoke(gum,toothpick,exercise). 4)  It is  important that you exercise regularly at least 20 minutes 5 times a week. If you develop chest pain, have severe difficulty breathing, or feel very tired , stop exercising immediately and seek medical attention. 5)  You need to lose weight. Consider a lower calorie diet and regular exercise.  6)  Check your Blood Pressure regularly. If it is above 140/90: you should make an appointment. Prescriptions: SYNTHROID 150 MCG TABS (LEVOTHYROXINE SODIUM) One by mouth once daily  #30 x 5   Entered and Authorized by:   Etta Grandchild MD   Signed by:   Etta Grandchild MD on 09/14/2010   Method used:   Electronically to        [facility] (retail)       [address]       [place], [place]  [address]       Ph: [phone]       Fax: [phone]   RxID:   [id number]    Orders Added: 1)  Est. Patient Level IV [84132]

## 2010-09-21 NOTE — Progress Notes (Signed)
Summary: Generic  Phone Note From Pharmacy   Caller: [facility] [phone] Summary of Call: Per pharmacy rx recevied for Wellbutrin XL DAW. They want to know if ok to give generic, pt has had in the past. Please advise Thanks Initial call taken by: Rock Nephew CMA,  June 01, 2010 10:29 AM  Follow-up for Phone Call        yes Follow-up by: Etta Grandchild MD,  June 01, 2010 10:29 AM  Additional Follow-up for Phone Call Additional follow up Details #1::        Pharmacy notified per MD ok to give generic.Alvy Beal Archie CMA  June 01, 2010 1:46 PM

## 2010-09-21 NOTE — Progress Notes (Signed)
  Phone Note Outgoing Call   Summary of Call: she needs a higher dose of synthroid Initial call taken by: Etta Grandchild MD,  February 15, 2010 8:52 AM    New/Updated Medications: SYNTHROID 175 MCG TABS (LEVOTHYROXINE SODIUM) One by mouth once daily Prescriptions: SYNTHROID 175 MCG TABS (LEVOTHYROXINE SODIUM) One by mouth once daily  #30 x 5   Entered and Authorized by:   Etta Grandchild MD   Signed by:   Etta Grandchild MD on 02/15/2010   Method used:   Electronically to        [facility] (retail)       [address]       [place], [place]  [address]       Ph: [phone]       Fax: [phone]   RxID:   [id number]

## 2010-09-21 NOTE — Progress Notes (Signed)
Summary: refill  Phone Note Refill Request Message from:  Fax from Pharmacy on October 31, 2009 1:13 PM  Refills Requested: Medication #1:  CLONAZEPAM 0.5 MG TABS Take 1 tablet by mouth two times a day   Dosage confirmed as above?Dosage Confirmed   Last Refilled: 02/22/2009  Is this ok to refill?  [facility]  Initial call taken by: Rock Nephew CMA,  October 31, 2009 1:13 PM  Follow-up for Phone Call        yes Follow-up by: Etta Grandchild MD,  October 31, 2009 1:25 PM    Prescriptions: CLONAZEPAM 0.5 MG TABS (CLONAZEPAM) Take 1 tablet by mouth two times a day  #180 x 1   Entered by:   Rock Nephew CMA   Authorized by:   Etta Grandchild MD   Signed by:   Rock Nephew CMA on 11/01/2009   Method used:   Telephoned to ...       [facility] (retail)       [address]       [place], [place]  [address]       Ph: [phone]       Fax: [phone]   RxID:   [id number]

## 2010-09-21 NOTE — Progress Notes (Signed)
  Phone Note Outgoing Call   Summary of Call: LA: please tell her that the xrays did show a significant amount of arthritis so I have done a referral to ortho Initial call taken by: Etta Grandchild MD,  May 29, 2010 3:12 PM  Follow-up for Phone Call        LM with other for pt to call back.Marland KitchenMarland KitchenAlvy Beal Archie CMA  May 29, 2010 4:19 PM  Follow-up by: Etta Grandchild MD,  May 29, 2010 3:11 PM  Additional Follow-up for Phone Call Additional follow up Details #1::        informed pt  Additional Follow-up by: Ami Bullins CMA,  May 29, 2010 4:40 PM

## 2010-09-21 NOTE — Letter (Signed)
Summary: Out of Work  [facility]  [address] [place], [place] [address]   Phone: [phone]  Fax: [phone]    September 13, 2009   Employee:  KAYDINCE [employer]OW[employer]    To Whom It May Concern:   For Medical reasons, please excuse the above named employee from work for the following dates:  Start: 09/12/09    End: 09/15/09, Return to work on Friday 09/16/09    If you need additional information, please feel free to contact our office.         Sincerely,   Dr. Rene Paci

## 2010-10-12 ENCOUNTER — Ambulatory Visit
Admission: RE | Admit: 2010-10-12 | Discharge: 2010-10-12 | Disposition: A | Discharge status: Home / Self Care | Payer: BC Managed Care – PPO | Source: Ambulatory Visit | Attending: Internal Medicine | Admitting: Internal Medicine

## 2010-10-12 ENCOUNTER — Other Ambulatory Visit: Payer: Self-pay | Admitting: Internal Medicine

## 2010-10-12 ENCOUNTER — Ambulatory Visit (INDEPENDENT_AMBULATORY_CARE_PROVIDER_SITE_OTHER): Payer: BC Managed Care – PPO | Admitting: Internal Medicine

## 2010-10-12 ENCOUNTER — Ambulatory Visit (INDEPENDENT_AMBULATORY_CARE_PROVIDER_SITE_OTHER)
Admission: RE | Admit: 2010-10-12 | Discharge: 2010-10-12 | Disposition: A | Discharge status: Home / Self Care | Payer: BC Managed Care – PPO | Source: Ambulatory Visit | Attending: Internal Medicine | Admitting: Internal Medicine

## 2010-10-12 ENCOUNTER — Encounter: Payer: Self-pay | Admitting: Internal Medicine

## 2010-10-12 DIAGNOSIS — M159 Polyosteoarthritis, unspecified: Secondary | ICD-10-CM

## 2010-10-12 DIAGNOSIS — M25569 Pain in unspecified knee: Secondary | ICD-10-CM

## 2010-10-12 DIAGNOSIS — I10 Essential (primary) hypertension: Secondary | ICD-10-CM

## 2010-10-13 ENCOUNTER — Telehealth: Payer: Self-pay | Admitting: Internal Medicine

## 2010-10-17 NOTE — Progress Notes (Signed)
Summary: RESULTS  Phone Note Call from Patient Call back at Home Phone [phone]   Summary of Call: Patient is requesting results of xrays.  Initial call taken by: Lamar Sprinkles, CMA,  October 13, 2010 10:51 AM  Follow-up for Phone Call        a significant amount of arthritis in both knees Follow-up by: Etta Grandchild MD,  October 13, 2010 10:53 AM  Additional Follow-up for Phone Call Additional follow up Details #1::        no ans, no vm................Marland KitchenLamar Sprinkles, CMA  October 13, 2010 3:31 PM     Additional Follow-up for Phone Call Additional follow up Details #2::    Pt informed  Follow-up by: Lamar Sprinkles, CMA,  October 13, 2010 5:38 PM

## 2010-10-17 NOTE — Assessment & Plan Note (Signed)
Summary: BACK OF R LEG PAIN   STC   Vital Signs:  Patient profile:   60 year old female Menstrual status:  postmenopausal Height:      66 inches Weight:      272 pounds O2 Sat:      98 % Temp:     97.9 degrees F oral Pulse rate:   64 / minute Pulse rhythm:   regular Resp:     16 per minute BP sitting:   138 / 82  (left arm) Cuff size:   large  Vitals Entered By: Rock Nephew CMA (October 12, 2010 9:45 AM) CC: Patient c/o back and R leg pain/ also need med refill Is Patient Diabetic? No Pain Assessment Patient in pain? yes     Location: knee Intensity: 1  Does patient need assistance? Functional Status Self care Ambulation Normal   Primary Care Provider:  Etta Grandchild MD  CC:  Patient c/o back and R leg pain/ also need med refill.  History of Present Illness: She returns c/o worsening bilateral knee pain with locking/clicking/buckling/instability.  Preventive Screening-Counseling & Management  Alcohol-Tobacco     Alcohol drinks/day: 0     Alcohol Counseling: not indicated; patient does not drink     Smoking Status: current     Smoking Cessation Counseling: yes     Smoke Cessation Stage: precontemplative     Packs/Day: 0.5     Pack years: 20     Tobacco Counseling: to quit use of tobacco products  Hep-HIV-STD-Contraception     Hepatitis Risk: no risk noted     HIV Risk: no risk noted     STD Risk: no risk noted      Sexual History:  currently monogamous.        Drug Use:  never.        Blood Transfusions:  no.    Clinical Review Panels:  Diabetes Management   HgBA1C:  6.0 (12/31/2008)   Creatinine:  0.98 (09/12/2010)  CBC   WBC:  7.4 (09/12/2010)   RBC:  5.30 (09/12/2010)   Hgb:  14.6 (09/12/2010)   Hct:  42.6 (09/12/2010)   Platelets:  241 (09/12/2010)   MCV  80 (09/12/2010)   MCHC  34.6 (10/27/2008)   RDW  15.6 (09/12/2010)   PMN:  53 (09/12/2010)   Lymphs:  38.8 (10/27/2008)   Monos:  9 (09/12/2010)   Eosinophils:  0.1  (09/12/2010)   Basophil:  0 (09/12/2010)  Complete Metabolic Panel   Glucose:  105 (09/12/2010)   Sodium:  140 (09/12/2010)   Potassium:  4.3 (09/12/2010)   Chloride:  103 (09/12/2010)   CO2:  32 (09/12/2010)   BUN:  16 (09/12/2010)   Creatinine:  0.98 (09/12/2010)   Albumin:  4.3 (09/12/2010)   Total Protein:  7.4 (09/12/2010)   Calcium:  9.6 (09/12/2010)   Total Bili:  0.4 (09/12/2010)   Alk Phos:  86 (09/12/2010)   SGPT (ALT):  43 (09/12/2010)   SGOT (AST):  42 (09/12/2010)   Medications Prior to Update: 1)  Wellbutrin Xl 300 Mg Xr24h-Tab (Bupropion Hcl) .... Take 1 Tablet By Mouth Once A Day 2)  Hydrochlorothiazide 25 Mg Tabs (Hydrochlorothiazide) .... Take 1 Tablet By Mouth Once A Day 3)  Clonazepam 0.5 Mg Tabs (Clonazepam) .... Take 1 Tablet By Mouth Two Times A Day 4)  Zoloft 100 Mg Tabs (Sertraline Hcl) .... Take 1 Tablet By Mouth Once A Day 5)  Protonix 40 Mg Tbec (Pantoprazole Sodium) .... Take  1 Tablet By Mouth Once A Day 6)  Bayer Aspirin 81 7)  Toprol Xl 50 Mg Xr24h-Tab (Metoprolol Succinate) .... Once Daily 8)  Klor-Con 10 10 Meq Cr-Tabs (Potassium Chloride) .... One By Mouth Two Times A Day With Food 9)  Allopurinol 300 Mg Tabs (Allopurinol) .Marland Kitchen.. 1 By Mouth Once Daily As Needed 10)  Pennsaid 1.5 % Soln (Diclofenac Sodium) .... Apply To Aa Three Times A Day As Directed 11)  Tramadol Hcl 50 Mg Tabs (Tramadol Hcl) .Marland Kitchen.. 1-2 By Mouth Qid As Needed For Foot Pain 12)  Meclizine Hcl 12.5 Mg Tabs (Meclizine Hcl) .Marland Kitchen.. 1-2 By Mouth Qid As Needed For Dizziness 13)  Synthroid 150 Mcg Tabs (Levothyroxine Sodium) .... One By Mouth Once Daily  Current Medications (verified): 1)  Wellbutrin Xl 300 Mg Xr24h-Tab (Bupropion Hcl) .... Take 1 Tablet By Mouth Once A Day 2)  Hydrochlorothiazide 25 Mg Tabs (Hydrochlorothiazide) .... Take 1 Tablet By Mouth Once A Day 3)  Clonazepam 0.5 Mg Tabs (Clonazepam) .... Take 2 Tablet By Mouth Two Times A Day 4)  Zoloft 100 Mg Tabs (Sertraline Hcl)  .... Take 1 Tablet By Mouth Once A Day 5)  Protonix 40 Mg Tbec (Pantoprazole Sodium) .... Take 1 Tablet By Mouth Once A Day 6)  Bayer Aspirin 81 7)  Toprol Xl 50 Mg Xr24h-Tab (Metoprolol Succinate) .... Once Daily 8)  Klor-Con 10 10 Meq Cr-Tabs (Potassium Chloride) .... One By Mouth Two Times A Day With Food 9)  Allopurinol 300 Mg Tabs (Allopurinol) .Marland Kitchen.. 1 By Mouth Once Daily As Needed 10)  Pennsaid 1.5 % Soln (Diclofenac Sodium) .... Apply To Aa Three Times A Day As Directed 11)  Tramadol Hcl 50 Mg Tabs (Tramadol Hcl) .Marland Kitchen.. 1-2 By Mouth Qid As Needed For Foot Pain 12)  Meclizine Hcl 12.5 Mg Tabs (Meclizine Hcl) .Marland Kitchen.. 1-2 By Mouth Qid As Needed For Dizziness 13)  Synthroid 150 Mcg Tabs (Levothyroxine Sodium) .... One By Mouth Once Daily 14)  Celebrex 200 Mg Caps (Celecoxib) .... One By Mouth Once Daily For Joint Pain  Allergies (verified): 1)  ! Sulfa 2)  ! 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 Family History of Arthritis  Social History: Reviewed history from 08/31/2010 and no changes required. 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  The  patient denies anorexia, fever, weight loss, chest pain, syncope, dyspnea on exertion, peripheral edema, prolonged cough, headaches, hemoptysis, abdominal pain, hematuria, suspicious skin lesions, transient blindness, difficulty walking, and depression.   MS:  Complains of joint pain and stiffness; denies joint redness, joint swelling, loss of strength, low back pain, mid back pain, muscle aches, and muscle weakness.  Physical Exam  General:  alert, well-developed, well-nourished, and cooperative to examination.   overweight-appearing.   Head:  normocephalic, atraumatic, no abnormalities observed, and no abnormalities palpated.  Mouth:  Oral mucosa and oropharynx without lesions or exudates.  Teeth in good repair. Neck:  supple, full ROM, no masses, no thyromegaly, and no neck tenderness.   Lungs:  normal respiratory effort, no intercostal retractions, no accessory muscle use, normal breath sounds, no dullness, no crackles, and no wheezes.   Heart:  normal rate, regular rhythm, no murmur, no gallop, no rub, and no JVD.   Abdomen:  soft, non-tender, normal bowel sounds, no distention, no masses, no guarding, no rigidity, no rebound tenderness, no abdominal hernia, no hepatomegaly, and no splenomegaly.   Msk:  both knees have mild crepitance but no swelling, effusions, ttp, laxity, or warmth. Pulses:  R and L carotid,radial,femoral,dorsalis pedis and posterior tibial pulses are full and equal bilaterally Extremities:  No clubbing, cyanosis, edema, or deformity noted with normal full range of motion of all joints.   Neurologic:  No cranial nerve deficits noted. Station and gait are normal. Plantar reflexes are down-going bilaterally. DTRs are symmetrical throughout. Sensory, motor and coordinative functions appear intact. Skin:  turgor normal, color normal, no rashes, no suspicious lesions, no ecchymoses, no petechiae, no purpura, no ulcerations, and no edema.   Cervical Nodes:  no anterior cervical  adenopathy and no posterior cervical adenopathy.   Psych:  Cognition and judgment appear intact. Alert and cooperative with normal attention span and concentration. No apparent delusions, illusions, hallucinations   Impression & Recommendations:  Problem # 1:  KNEE PAIN, BILATERAL (ICD-719.46) Assessment New  Her updated medication list for this problem includes:    Tramadol Hcl 50 Mg Tabs (Tramadol hcl) .Marland Kitchen... 1-2 by mouth qid as needed for foot pain    Celebrex 200 Mg Caps (Celecoxib) ..... 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 1-2 by mouth qid as needed for foot pain    Celebrex 200 Mg Caps (Celecoxib) ..... One by mouth once daily for joint pain  Orders: T-Knee Comp Right 4 Views (73564TC) T-Knee Comp Left 4 Views [phone]) Physical Therapy Referral (PT)  Problem # 3:  HYPERTENSION (ICD-401.9) Assessment: Unchanged  Her updated medication list for this problem includes:    Hydrochlorothiazide 25 Mg Tabs (Hydrochlorothiazide) .Marland Kitchen... Take 1 tablet by mouth once a day    Toprol Xl 50 Mg Xr24h-tab (Metoprolol succinate) ..... Once daily  BP today: 138/82 Prior BP: 124/82 (09/14/2010)  Prior 10 Yr Risk Heart Disease: Not enough information (02/22/2009)  Labs Reviewed: K+: 4.3 (09/12/2010) Creat: : 0.98 (09/12/2010)     Complete Medication List: 1)  Wellbutrin Xl 300 Mg Xr24h-tab (Bupropion hcl) .... Take 1 tablet by mouth once a day 2)  Hydrochlorothiazide 25 Mg Tabs (Hydrochlorothiazide) .... Take 1 tablet by mouth once a day 3)  Clonazepam 0.5 Mg Tabs (Clonazepam) .... Take 2 tablet by mouth two times a day 4)  Zoloft 100 Mg Tabs (Sertraline hcl) .... Take 1  tablet by mouth once a day 5)  Protonix 40 Mg Tbec (Pantoprazole sodium) .... Take 1 tablet by mouth once a day 6)  Bayer Aspirin 81  7)  Toprol Xl 50 Mg Xr24h-tab (Metoprolol succinate) .... Once daily 8)  Klor-con 10 10 Meq Cr-tabs (Potassium chloride) .... One by mouth two times a day with  food 9)  Allopurinol 300 Mg Tabs (Allopurinol) .Marland Kitchen.. 1 by mouth once daily as needed 10)  Pennsaid 1.5 % Soln (Diclofenac sodium) .... Apply to aa three times a day as directed 11)  Tramadol Hcl 50 Mg Tabs (Tramadol hcl) .Marland Kitchen.. 1-2 by mouth qid as needed for foot pain 12)  Meclizine Hcl 12.5 Mg Tabs (Meclizine hcl) .Marland Kitchen.. 1-2 by mouth qid as needed for dizziness 13)  Synthroid 150 Mcg Tabs (Levothyroxine sodium) .... One by mouth once daily 14)  Celebrex 200 Mg Caps (Celecoxib) .... One by mouth once daily for joint pain  Patient Instructions: 1)  Please schedule a follow-up appointment in 1 month. 2)  It is important that you exercise regularly at least 20 minutes 5 times a week. If you develop chest pain, have severe difficulty breathing, or feel very tired , stop exercising immediately and seek medical attention. 3)  You need to lose weight. Consider a lower calorie diet and regular exercise.  4)  Take 650-1000mg  of Tylenol every 4-6 hours as needed for relief of pain or comfort of fever AVOID taking more than 4000mg   in a 24 hour period (can cause liver damage in higher doses). Prescriptions: CELEBREX 200 MG CAPS (CELECOXIB) One by mouth once daily for joint pain  #21 x 0   Entered and Authorized by:   Etta Grandchild MD   Signed by:   Etta Grandchild MD on 10/12/2010   Method used:   Telephoned to ...       [facility] (retail)       [address]       [place], [place]  [address]       Ph: [phone]       Fax: [phone]   RxID:   [id number]    Orders Added: 1)  T-Knee Comp Right 4 Views [73564TC] 2)  T-Knee Comp Left 4 Views [73564TC] 3)  Physical  Therapy Referral [PT] 4)  Est. Patient Level IV [44034]

## 2010-11-13 ENCOUNTER — Encounter: Payer: Self-pay | Admitting: Internal Medicine

## 2010-11-13 ENCOUNTER — Ambulatory Visit (INDEPENDENT_AMBULATORY_CARE_PROVIDER_SITE_OTHER): Payer: BC Managed Care – PPO | Admitting: Internal Medicine

## 2010-11-13 DIAGNOSIS — E039 Hypothyroidism, unspecified: Secondary | ICD-10-CM

## 2010-11-13 DIAGNOSIS — I10 Essential (primary) hypertension: Secondary | ICD-10-CM

## 2010-11-13 DIAGNOSIS — J45909 Unspecified asthma, uncomplicated: Secondary | ICD-10-CM | POA: Insufficient documentation

## 2010-11-13 DIAGNOSIS — J209 Acute bronchitis, unspecified: Secondary | ICD-10-CM

## 2010-11-13 DIAGNOSIS — M25569 Pain in unspecified knee: Secondary | ICD-10-CM

## 2010-11-13 MED ORDER — BUDESONIDE-FORMOTEROL FUMARATE 160-4.5 MCG/ACT IN AERO: 2.0000 | INHALATION_SPRAY | Freq: Two times a day (BID) | RESPIRATORY_TRACT | Status: DC

## 2010-11-13 MED ORDER — HYDROCHLOROTHIAZIDE 25 MG PO TABS: 25.0000 mg | ORAL_TABLET | Freq: Every day | ORAL | Status: DC

## 2010-11-13 MED ORDER — CLONAZEPAM 0.5 MG PO TABS: 1.0000 mg | ORAL_TABLET | Freq: Two times a day (BID) | ORAL | Status: DC | PRN

## 2010-11-13 MED ORDER — CELECOXIB 200 MG PO CAPS: 200.0000 mg | ORAL_CAPSULE | Freq: Every day | ORAL | Status: DC

## 2010-11-13 MED ORDER — CEFUROXIME AXETIL 500 MG PO TABS: 500.0000 mg | ORAL_TABLET | Freq: Two times a day (BID) | ORAL | Status: AC

## 2010-11-13 NOTE — Patient Instructions (Signed)
Bronchitis Bronchitis is the body's way of reacting to injury and/or infection (inflammation) of the bronchi. Bronchi are the air tubes that extend from the windpipe into the lungs. If the inflammation becomes severe, it may cause shortness of breath.  CAUSES Inflammation may be caused by:  A virus.   Germs (bacteria).   Dust.   Allergens.   Pollutants and many other irritants.  The cells lining the bronchial tree are covered with tiny hairs (cilia). These constantly beat upward, away from the lungs, toward the mouth. This keeps the lungs free of pollutants. When these cells become too irritated and are unable to do their job, mucus begins to develop. This causes the characteristic cough of bronchitis. The cough clears the lungs when the cilia are unable to do their job. Without either of these protective mechanisms, the mucus would settle in the lungs. Then you would develop pneumonia. Smoking is a common cause of bronchitis and can contribute to pneumonia. Stopping this habit is the single most important thing you can do to help yourself. TREATMENT  Your caregiver may prescribe an antibiotic if the cough is caused by bacteria. Also, medicines that open up your airways make it easier to breathe. Your caregiver may also recommend or prescribe an expectorant. It will loosen the mucus to be coughed up. Only take over-the-counter or prescription medicines for pain, discomfort, or fever as directed by your caregiver.   Removing whatever causes the problem (smoking, for example) is critical to preventing the problem from getting worse.   Cough suppressants may be prescribed for relief of cough symptoms.   Inhaled medicines may be prescribed to help with symptoms now and to help prevent problems from returning.   For those with recurrent (chronic) bronchitis, there may be a need for steroid medicines.  SEEK IMMEDIATE MEDICAL CARE IF:  During treatment, you develop more pus-like mucus  (purulent sputum).   You or your child has an oral temperature above 100, not controlled by medicine.   Your baby is older than 3 months with a rectal temperature of 102 F (38.9 C) or higher.   Your baby is 56 months old or younger with a rectal temperature of 100.4 F (38 C) or higher.   You become progressively more ill.   You have increased difficulty breathing, wheezing, or shortness of breath.  It is necessary to seek immediate medical care if you are elderly or sick from any other disease. MAKE SURE YOU:  Understand these instructions.   Will watch your condition.   Will get help right away if you are not doing well or get worse.  Document Released: 08/06/2005 Document Re-Released: 10/31/2009 [facility] Patient Information 2011 [place], [place].

## 2010-11-13 NOTE — Assessment & Plan Note (Signed)
Continue current meds 

## 2010-11-13 NOTE — Assessment & Plan Note (Signed)
Start Symbicort

## 2010-11-13 NOTE — Assessment & Plan Note (Signed)
Start ceftin and symbicort

## 2010-11-13 NOTE — Progress Notes (Signed)
Subjective:    Patient ID: Sonia Dawson, female    DOB: 1951-04-22, 60 y.o.   MRN: [id number]  Cough This is a recurrent problem. The current episode started in the past 7 days. The problem has been gradually worsening. The problem occurs every few hours. The cough is productive of purulent sputum. Associated symptoms include chills, ear congestion, nasal congestion, postnasal drip, a sore throat and wheezing. Pertinent negatives include no chest pain, ear pain, eye redness, fever, headaches, heartburn, hemoptysis, myalgias, rash, rhinorrhea, shortness of breath, sweats or weight loss. The symptoms are aggravated by nothing. Risk factors for lung disease include smoking/tobacco exposure. She has tried OTC cough suppressant for the symptoms. The treatment provided moderate relief. There is no history of COPD.      Review of Systems  Constitutional: Positive for chills. Negative for fever, weight loss, diaphoresis, activity change, appetite change, fatigue and unexpected weight change.  HENT: Positive for sore throat and postnasal drip. Negative for hearing loss, ear pain, nosebleeds, congestion, facial swelling, rhinorrhea, sneezing, neck pain, tinnitus and ear discharge.   Eyes: Negative for pain, discharge and redness.  Respiratory: Positive for cough and wheezing. Negative for apnea, hemoptysis, choking, chest tightness, shortness of breath and stridor.   Cardiovascular: Negative for chest pain, palpitations and leg swelling.  Gastrointestinal: Negative for heartburn, diarrhea, constipation and abdominal distention.  Musculoskeletal: Negative for myalgias.  Skin: Negative for pallor and rash.  Neurological: Negative for headaches.  Hematological: Negative for adenopathy. Does not bruise/bleed easily.  Psychiatric/Behavioral: Negative for behavioral problems, confusion, dysphoric mood, decreased concentration and agitation.       Objective:   Physical Exam  Constitutional: She is oriented  to person, place, and time. She appears well-developed and well-nourished.  HENT:  Head: Normocephalic and atraumatic.  Right Ear: External ear normal. No drainage, swelling or tenderness. Tympanic membrane is not injected and not erythematous. No middle ear effusion.  Left Ear: External ear normal. No drainage, swelling or tenderness. Tympanic membrane is not injected and not erythematous.  No middle ear effusion.  Mouth/Throat: Oropharynx is clear and moist and mucous membranes are normal. Mucous membranes are not pale, not dry and not cyanotic. Normal dentition. No oropharyngeal exudate, posterior oropharyngeal edema, posterior oropharyngeal erythema or tonsillar abscesses.  Eyes: Conjunctivae and EOM are normal. Pupils are equal, round, and reactive to light. Right eye exhibits no discharge. Left eye exhibits no discharge. No scleral icterus.  Neck: No thyromegaly present.  Cardiovascular: Normal rate, regular rhythm, normal heart sounds and intact distal pulses.  Exam reveals no gallop and no friction rub.   No murmur heard. Pulmonary/Chest: No accessory muscle usage. Not tachypneic. No respiratory distress. She has no decreased breath sounds. She has wheezes in the right middle field and the left middle field. She has no rhonchi. She has no rales. She exhibits no tenderness.  Abdominal: She exhibits no distension and no mass. There is no tenderness. There is no rebound and no guarding.  Musculoskeletal: Normal range of motion. She exhibits no edema and no tenderness.  Lymphadenopathy:       Head (right side): No occipital adenopathy present.       Head (left side): No occipital adenopathy present.    She has no cervical adenopathy.  Neurological: She is alert and oriented to person, place, and time. She has normal reflexes. No cranial nerve deficit.  Skin: Skin is warm and dry. No rash noted. She is not diaphoretic. No erythema. No pallor.  Psychiatric: She  has a normal mood and affect. Her  behavior is normal. Judgment and thought content normal.          Assessment & Plan:

## 2010-11-23 LAB — DIFFERENTIAL
Basophils Absolute: 0.1 10*3/uL (ref 0.0–0.1)
Eosinophils Absolute: 0.2 10*3/uL (ref 0.0–0.7)
Lymphs Abs: 2.8 10*3/uL (ref 0.7–4.0)
Neutrophils Relative %: 52 % (ref 43–77)

## 2010-11-23 LAB — CBC
MCV: 88.7 fL (ref 78.0–100.0)
Platelets: 198 10*3/uL (ref 150–400)
RDW: 13.8 % (ref 11.5–15.5)
WBC: 7.6 10*3/uL (ref 4.0–10.5)

## 2010-11-23 LAB — URINALYSIS, ROUTINE W REFLEX MICROSCOPIC
Nitrite: NEGATIVE
Protein, ur: NEGATIVE mg/dL
Specific Gravity, Urine: 1.014 (ref 1.005–1.030)
Urobilinogen, UA: 0.2 mg/dL (ref 0.0–1.0)

## 2010-11-23 LAB — BASIC METABOLIC PANEL
BUN: 11 mg/dL (ref 6–23)
Chloride: 101 mEq/L (ref 96–112)
Creatinine, Ser: 1.2 mg/dL (ref 0.4–1.2)
GFR calc non Af Amer: 46 mL/min — ABNORMAL LOW (ref >60)
Glucose, Bld: 106 mg/dL — ABNORMAL HIGH (ref 70–99)

## 2010-11-23 LAB — POCT CARDIAC MARKERS
CKMB, poc: 1.8 ng/mL (ref 1.0–8.0)
Myoglobin, poc: 123 ng/mL (ref 12–200)
Myoglobin, poc: 95.4 ng/mL (ref 12–200)
Troponin i, poc: <0.05 ng/mL (ref 0.00–0.09)

## 2010-11-28 LAB — BASIC METABOLIC PANEL
CO2: 29 mEq/L (ref 19–32)
Calcium: 9.7 mg/dL (ref 8.4–10.5)
Chloride: 104 mEq/L (ref 96–112)
GFR calc Af Amer: >60 mL/min (ref >60)
Glucose, Bld: 86 mg/dL (ref 70–99)
Sodium: 139 mEq/L (ref 135–145)

## 2010-11-29 ENCOUNTER — Ambulatory Visit (INDEPENDENT_AMBULATORY_CARE_PROVIDER_SITE_OTHER): Payer: BC Managed Care – PPO | Admitting: Internal Medicine

## 2010-11-29 ENCOUNTER — Encounter: Payer: Self-pay | Admitting: Internal Medicine

## 2010-11-29 VITALS — BP 126/80 | HR 93 | Temp 97.4°F | Resp 16 | Ht 66.0 in | Wt 277.0 lb

## 2010-11-29 DIAGNOSIS — H6690 Otitis media, unspecified, unspecified ear: Secondary | ICD-10-CM

## 2010-11-29 DIAGNOSIS — J45909 Unspecified asthma, uncomplicated: Secondary | ICD-10-CM

## 2010-11-29 DIAGNOSIS — F17200 Nicotine dependence, unspecified, uncomplicated: Secondary | ICD-10-CM

## 2010-11-29 DIAGNOSIS — J449 Chronic obstructive pulmonary disease, unspecified: Secondary | ICD-10-CM

## 2010-11-29 DIAGNOSIS — E039 Hypothyroidism, unspecified: Secondary | ICD-10-CM

## 2010-11-29 MED ORDER — CEFUROXIME AXETIL 500 MG PO TABS: 500.0000 mg | ORAL_TABLET | Freq: Two times a day (BID) | ORAL | Status: AC

## 2010-11-29 MED ORDER — BUDESONIDE-FORMOTEROL FUMARATE 160-4.5 MCG/ACT IN AERO: 2.0000 | INHALATION_SPRAY | Freq: Two times a day (BID) | RESPIRATORY_TRACT | Status: DC

## 2010-11-29 MED ORDER — FLUCONAZOLE 150 MG PO TABS: 150.0000 mg | ORAL_TABLET | Freq: Once | ORAL | Status: AC

## 2010-11-29 MED ORDER — ANTIPYRINE-BENZOCAINE 5.4-1.4 % OT SOLN: 3.0000 [drp] | OTIC | Status: AC | PRN

## 2010-11-29 NOTE — Assessment & Plan Note (Signed)
She has improved on Symbicort so I will continue this and again ask her to stop smoking

## 2010-11-29 NOTE — Assessment & Plan Note (Signed)
She is doing well on Symbicort so I will continue it

## 2010-11-29 NOTE — Assessment & Plan Note (Signed)
This has improved some but she still smokes a few cigs per day so I have asked to stop smoking completely

## 2010-11-29 NOTE — Assessment & Plan Note (Signed)
Her most recent TSH was normal so she will continue her current dose

## 2010-11-29 NOTE — Progress Notes (Signed)
Subjective:     Sonia Dawson is a 60 y.o. female who presents with ear pain and possible ear infection. Symptoms include: left ear pain. Onset of symptoms was 3 weeks ago, and have been gradually improving since that time. Associated symptoms include: none.  Patient denies: achiness, chills, congestion, coryza, fever , headache, low grade fever, non productive cough, post nasal drip, productive cough, sinus pressure, sneezing and sore throat. She is drinking plenty of fluids.  The following portions of the patient's history were reviewed and updated as appropriate: allergies, current medications, past family history, past medical history, past social history, past surgical history and problem list.  Review of Systems Constitutional: positive for none, negative for anorexia, chills, fatigue, fevers, malaise, night sweats, sweats and weight loss Ears, nose, mouth, throat, and face: positive for earaches Respiratory: negative for cough, dyspnea on exertion, hemoptysis, pleurisy/chest pain, pneumonia, sputum, stridor and wheezing   Objective:    BP 126/80  Pulse 93  Temp(Src) 97.4 F (36.3 C) (Oral)  Resp 16  Ht 5\' 6"  (1.676 m)  Wt 277 lb (125.646 kg)  BMI 44.71 kg/m2  SpO2 97% General:  alert, cooperative, appears stated age, no distress and moderately obese  Right Ear: normal landmarks and mobility and normal mobility  Left Ear: TM is red but there is no effusion and EAC is normal with no swelling/erythema  Mouth:  lips, mucosa, and tongue normal; teeth and gums normal  Neck: no adenopathy, no carotid bruit, no JVD, supple, symmetrical, trachea midline and thyroid not enlarged, symmetric, no tenderness/mass/nodules    Lab Results  Component Value Date   WBC 7.4 09/12/2010   HGB 14.6 09/12/2010   HCT 42.6 09/12/2010   PLT 241 09/12/2010   ALT 43 09/12/2010   AST 42 09/12/2010   NA 140 09/12/2010   K 4.3 09/12/2010   CL 103 09/12/2010   CREATININE 0.98 09/12/2010   BUN 16 09/12/2010   CO2  32 09/12/2010   TSH 0.325 09/12/2010   INR 0.9 RATIO 09/19/2007   HGBA1C 6.0 12/31/2008    Assessment:    Left acute otitis media   Plan:    Treatment: ceftin and auralgan. OTC analgesia as needed. Fluids, rest, avoid carbonated/alcoholic and caffeinated beverages.  Follow up in 2 weeks if not improving.

## 2010-11-29 NOTE — Patient Instructions (Signed)
Otitis Media (Middle Ear Infection) You or your child has otitis media. This is an infection of the middle chamber of the ear. This condition is common in young children and often follows upper respiratory infections. Symptoms of otitis media may include earache or ear fullness, hearing loss, or fever. If the ear drum ruptures, a middle ear infection may also cause bloody or pus-like discharge from the ear. Fussiness, irritability, and persistent crying may be the only signs of otitis media in small children. Otitis media can be caused by a germ (bacteria) or a virus. Medications to kill bacteria (antibiotics) may be used to treat bacterial otitis media. But antibiotics are not effective against viral infections. Not every case of bacterial otitis media requires antibiotics and depending on age, severity of infection, and other risk factors, observation may be all that is required. Ear drops or oral medicines may be prescribed to reduce pain, fever or congestion. Babies with ear infections should not be fed while lying on their backs. This increases the pressure and pain in the ear. Do not put cotton in the ear canal or clean it with cotton swabs. Swimming should be avoided if the eardrum has ruptured or if there is drainage from the ear canal. If your child experiences recurrent infections, your child may need to be referred to an Ear, Nose, and Throat specialist. HOME CARE INSTRUCTIONS  Take any antibiotic as directed by your caregiver. You or your child may feel better in a few days, but take all medicine or the infection may not respond and may become more difficult to treat.   Only take over-the-counter or prescription medicines for pain, discomfort, or fever as directed by your caregiver. Do not give aspirin to children.  Otitis media can lead to complications including rupture of the ear drum, long term hearing loss, and more severe infections. Call your caregiver for follow-up care at the end of  treatment. SEEK IMMEDIATE MEDICAL CARE IF:  Your or your child's problems do not improve within 2 to 3 days.  You or your child has an oral temperature above 100.5Smoking Cessation This document explains the best ways for you to quit smoking and new treatments to help. It lists new medicines that can double or triple your chances of quitting and quitting for good. It also considers ways to avoid relapses and concerns you may have about quitting, including weight gain. NICOTINE: A POWERFUL ADDICTION If you have tried to quit smoking, you know how hard it can be. It is hard because nicotine is a very addictive drug. For some people, it can be as addictive as heroin or cocaine. Usually, people make 2 or 3 tries, or more, before finally being able to quit. Each time you try to quit, you can learn about what helps and what hurts. Quitting takes hard work and a lot of effort, but you can quit smoking. QUITTING SMOKING IS ONE OF THE MOST IMPORTANT THINGS YOU WILL EVER DO: You will live longer, feel better, and live better.  The impact on your body of quitting smoking is felt almost immediately:  Within 20 minutes, blood pressure decreases. Pulse returns to its normal level.  After 8 hours, carbon monoxide levels in the blood return to normal. Oxygen level increases.  After 24 hours, chance of heart attack starts to decrease. Breath, hair, and body stop smelling like smoke.  After 48 hours, damaged nerve endings begin to recover. Sense of taste and smell improve.  After 72 hours, the body  is virtually free of nicotine. Bronchial tubes relax and breathing becomes easier.  After 2 to 12 weeks, lungs can hold more air. Exercise becomes easier and circulation improves.  Quitting will lower your chance of having a heart attack, stroke, cancer, or lung disease:  After 1 year, the risk of coronary heart disease is cut in half.  After 5 years, the risk of stroke falls to the same as a nonsmoker.  After 10 years,  the risk of lung cancer is cut in half and the risk of other cancers decreases significantly.  After 15 years, the risk of coronary heart disease drops, usually to the level of a nonsmoker.  If you are pregnant, quitting smoking will improve your chances of having a healthy baby.  The people you live with, especially your children, will be healthier.  You will have extra money to spend on things other than cigarettes.  FIVE KEYS TO QUITTING Studies have shown that these 5 steps will help you quit smoking and quit for good. You have the best chances of quitting if you use them together: Get ready.  Get support and encouragement.  Learn new skills and behaviors.  Get medicine to reduce your nicotine addiction and use it correctly.  Be prepared for relapse or difficult situations. Be determined to continue trying to quit, even if you do not succeed at first.  1. GET READY Set a quit date.  Change your environment.  Get rid of ALL cigarettes, ashtrays, matches, and lighters in your home, car, and place of work.  Do not let people smoke in your home.  Review your past attempts to quit. Think about what worked and what did not.  Once you quit, do not smoke. NOT EVEN A PUFF!  2. GET SUPPORT AND ENCOURAGEMENT Studies have shown that you have a better chance of being successful if you have help. You can get support in many ways. Tell your family, friends, and coworkers that you are going to quit and need their support. Ask them not to smoke around you.  Talk to your caregivers (doctor, dentist, nurse, pharmacist, psychologist, and/or smoking counselor).  Get individual, group, or telephone counseling and support. The more counseling you have, the better your chances are of quitting. Programs are available at [employer] and health centers. Call your local health department for information about programs in your area.  Spiritual beliefs and practices may help some smokers quit.  Quit meters are  Photographer that keep track of quit statistics, such as amount of "quit-time," cigarettes not smoked, and money saved.  Many smokers find one or more of the many self-help books available useful in helping them quit and stay off tobacco.  3. LEARN NEW SKILLS AND BEHAVIORS Try to distract yourself from urges to smoke. Talk to someone, go for a walk, or occupy your time with a task.  When you first try to quit, change your routine. Take a different route to work. Drink tea instead of coffee. Eat breakfast in a different place.  Do something to reduce your stress. Take a hot bath, exercise, or read a book.  Plan something enjoyable to do every day. Reward yourself for not smoking.  Explore interactive web-based programs that specialize in helping you quit.  4. GET MEDICINE AND USE IT CORRECTLY Medicines can help you stop smoking and decrease the urge to smoke. Combining medicine with the above behavioral methods and support can quadruple your chances of successfully quitting  smoking. The U.[place]. Food and Drug Administration (FDA) has approved 7 medicines to help you quit smoking. These medicines fall into 3 categories. Nicotine replacement therapy (delivers nicotine to your body without the negative effects and risks of smoking):  Nicotine gum: Available over-the-counter.  Nicotine lozenges: Available over-the-counter.  Nicotine inhaler: Available by prescription.  Nicotine nasal spray: Available by prescription.  Nicotine skin patches (transdermal): Available by prescription and over-the-counter.  Antidepressant medicine (helps people abstain from smoking, but how this works is unknown):  Bupropion sustained-release (SR) tablets: Available by prescription.  Nicotinic receptor partial agonist (simulates the effect of nicotine in your brain):  Varenicline tartrate tablets: Available by prescription.  Ask your caregiver for advice about which medicines to use and how  to use them. Carefully read the information on the package.  Everyone who is trying to quit may benefit from using a medicine. If you are pregnant or trying to become pregnant, nursing an infant, you are under age 30, or you smoke fewer than 10 cigarettes per day, talk to your caregiver before taking any nicotine replacement medicines.  You should stop using a nicotine replacement product and call your caregiver if you experience nausea, dizziness, weakness, vomiting, fast or irregular heartbeat, mouth problems with the lozenge or gum, or redness or swelling of the skin around the patch that does not go away.  Do not use any other product containing nicotine while using a nicotine replacement product.  Talk to your caregiver before using these products if you have diabetes, heart disease, asthma, stomach ulcers, you had a recent heart attack, you have high blood pressure that is not controlled with medicine, a history of irregular heartbeat, or you have been prescribed medicine to help you quit smoking.  5. BE PREPARED FOR RELAPSE OR DIFFICULT SITUATIONS Most relapses occur within the first 3 months after quitting. Do not be discouraged if you start smoking again. Remember, most people try several times before they finally quit.  You may have symptoms of withdrawal because your body is used to nicotine. You may crave cigarettes, be irritable, feel very hungry, cough often, get headaches, or have difficulty concentrating.  The withdrawal symptoms are only temporary. They are strongest when you first quit, but they will go away within 10 to 14 days.  Here are some difficult situations to watch for: Alcohol. Avoid drinking alcohol. Drinking lowers your chances of successfully quitting.  Caffeine. Try to reduce the amount of caffeine you consume. It also lowers your chances of successfully quitting.  Other smokers. Being around smoking can make you want to smoke. Avoid smokers.  Weight gain. Many smokers  will gain weight when they quit, usually less than 10 pounds. Eat a healthy diet and stay active. Do not let weight gain distract you from your main goal, quitting smoking. Some medicines that help you quit smoking may also help delay weight gain. You can always lose the weight gained after you quit.  Bad mood or depression. There are a lot of ways to improve your mood other than smoking.  If you are having problems with any of these situations, talk to your caregiver. SPECIAL SITUATIONS OR CONDITIONS Studies suggest that everyone can quit smoking. Your situation or condition can give you a special reason to quit. Pregnant women/New mothers: By quitting, you protect your baby's health and your own.  Hospitalized patients: By quitting, you reduce health problems and help healing.  Heart attack patients: By quitting, you reduce your risk of a second heart  attack.  Lung, head, and neck cancer patients: By quitting, you reduce your chance of a second cancer.  Parents of children and adolescents: By quitting, you protect your children from illnesses caused by secondhand smoke.  QUESTIONS TO THINK ABOUT Think about the following questions before you try to stop smoking. You may want to talk about your answers with your caregiver. Why do you want to quit?  If you tried to quit in the past, what helped and what did not?  What will be the most difficult situations for you after you quit? How will you plan to handle them?  Who can help you through the tough times? Your family? Friends? Caregiver?  What pleasures do you get from smoking? What ways can you still get pleasure if you quit?  Here are some questions to ask your caregiver: How can you help me to be successful at quitting?  What medicine do you think would be best for me and how should I take it?  What should I do if I need more help?  What is smoking withdrawal like? How can I get information on withdrawal?  Quitting takes hard work and a lot  of effort, but you can quit smoking. FOR MORE INFORMATION Smokefree.gov (http://www.davis-sullivan.com/) provides free, accurate, evidence-based information and professional assistance to help support the immediate and long-term needs of people trying to quit smoking. Document Released: 07/31/2001 Document Re-Released: 01/24/2010  [facility] Patient Information 2011 [place], [place]., not controlled by medicine.   Your baby is older than 3 months with a rectal temperature of 102 F (38.9 C) or higher.   Your baby is 33 months old or younger with a rectal temperature of 100.4 F (38 C) or higher.   Your child develops increased fussiness.   You or your child develops a stiff neck, severe headache, or confusion.   There is swelling around the ear.   There is dizziness, vomiting, unusual sleepiness, seizures, or twitching of facial muscles.   The pain or ear drainage persists beyond 2 days of antibiotic treatment.  Document Released: 09/13/2004 Document Re-Released: 01/24/2010 [facility] Patient Information 2011 [place], [place].

## 2010-11-29 NOTE — Assessment & Plan Note (Signed)
She is improving on ceftin but I think she needs a longer course so I have sent an Rx to her pharmacy, also I will offer her Auralgan for pain

## 2011-01-02 NOTE — Procedures (Signed)
VASCULAR LAB EXAM   INDICATION:  Follow up bilateral common iliac artery stents.   HISTORY:  Diabetes:  No.  Cardiac:  No.  Hypertension:  Yes.   EXAM:  Bilateral duplex of common iliac artery stents.   IMPRESSION:  1. Patent bilateral common iliac artery stents.  2. Stable ankle brachial indices noted.  3. Limited visualization of the aorta-iliac system due to patient body      habitus.   Please see attached worksheet for velocities.   ___________________________________________  Quita Skye Hart [place], M.D.   EM/MEDQ  D:  05/23/2010  T:  05/23/2010  Job:  [id number]

## 2011-01-02 NOTE — Letter (Signed)
September 19, 2007    [name]   RE:  Sonia Dawson, Sonia Dawson  MRN:  [id number]  /  DOB:  Nov 01, 1950   Dear [name]:   It is my pleasure to have treated you recently as a new patient in my  office.  I appreciate your confidence and the opportunity to participate  in your care.   Since I do have a busy inpatient endoscopy schedule and office schedule,  my office hours vary weekly.  I am, however, available for emergency  calls every day through my office.  If I cannot promptly meet an urgent  office appointment, another one of our gastroenterologists will be able  to assist you.   My well-trained staff are prepared to help you at all times.  For  emergencies after office hours, a physician from our gastroenterology  section is always available through my 24-hour answering service.   While you are under my care, I encourage discussion of your questions  and concerns, and I will be happy to return your calls as soon as I am  available.   Once again, I welcome you as a new patient and I look forward to a happy  and healthy relationship.    Sincerely,      Barbette Hair. Arlyce Dice, MD,FACG  Electronically Signed   RDK/MedQ  DD: 09/19/2007  DT: 09/19/2007  Job #: [id number]

## 2011-01-02 NOTE — Consult Note (Signed)
NEW PATIENT CONSULTATION   ANTAVIA, TANDY  DOB:  1950-10-08                                       05/23/2010  HYQMV#:[id number]   Patient is a 60 year old female who has been seen in our office  previously by Dr. Liliane Bade in 2007.  She had stenting of her right  iliac artery and embolized to her right popliteal artery and required a  popliteal thromboembolectomy.  She has done well since that time but has  had some chronic discomfort in her feet.  She did not describe true  claudication-type symptoms in the thigh and calf area.  She did have an  episode of bleeding from her varicose vein in her left posterior calf  area recently which required a trip to the emergency department, but  this has not recurred.  She has some mild aching and throbbing  discomfort in her legs and has had no treatment of venous disease in the  past.   CHRONIC MEDICAL PROBLEMS:  1. Hypertension.  2. Degenerative joint disease, previous left knee meniscus problems.  3. Hypothyroidism.  4. Venous insufficiency, diffuse spider veins.  5. Denies any coronary artery disease, diabetes, COPD or stroke.   SOCIAL HISTORY:  She is single and has 2 children, smokes about 5  cigarettes per day.  Does not use alcohol.   FAMILY HISTORY:  Positive for coronary artery disease in her mother and  father.  Diabetes and stroke in her grandfather.  Diabetes in her  mother.   REVIEW OF SYSTEMS:  Positive for weight gain, headaches, foot discomfort  bilaterally, wheezing, arthritis/joint pain, muscle pain, dyspnea on  exertion, depression.  All other systems are negative in review of  systems.   PHYSICAL EXAMINATION:  Blood pressure 153/92, heart rate 70, temperature  97.2, respirations 14.  Generally, she is an obese, well-nourished, well-  developed female in no apparent distress.  Alert, oriented x3.  HEENT:  Normal for age.  EOMs intact.  Lungs are clear to auscultation.  No  rhonchi or wheezing.   Cardiovascular:  Regular rhythm.  No murmurs.  Carotid pulses are 3+.  No audible bruits.  Abdomen is soft, obese.  No  palpable masses.  Musculoskeletal exam is free of major deformities.  Neurologic:  Normal.  Skin is free of rashes.  Lower extremity exam  reveals 3+ femoral, popliteal, and 2+ dorsalis pedis pulse on the right  and 3+ femoral popliteal and 3+ dorsalis pedis pulse in the left.  There  are diffuse reticular and spider veins bilaterally in the thighs and  calves in the area that bled and now has healed in the left posterior  calf area.  There is 1+ edema bilaterally.   Today I ordered lower extremity arterial Dopplers and a duplex scan of  her iliac system, which I have reviewed and interpreted.  ABIs are 0.93  on the right and 1.2 on the left.  There is no severe elevation of the  velocities in the iliac system.   I have reassured her regarding her arterial system, which is  asymptomatic.  If she has any further bleeding episodes from the  varicose veins, she will be in touch with [place] for a formal venous duplex  exam.  I did do a bedside venous duplex exam today with the SonoSite and  did not notice any  gross reflux at the saphenofemoral junction on the  left.  She will return to see [place] in a year for further follow-up of her  stenting procedure and her popliteal embolectomy.     Quita Skye Hart [place], M.D.  Electronically Signed   JDL/MEDQ  D:  05/23/2010  T:  05/24/2010  Job:  [id number]

## 2011-01-02 NOTE — Procedures (Signed)
BYPASS GRAFT EVALUATION   INDICATION:  Follow up bilateral revascularization with still some  aching behind her knees.   HISTORY:  Diabetes:  No.  Cardiac:  No.  Hypertension:  Yes.  Smoking:  Yes.  Previous Surgery:  Right and left common iliac artery PTA/stent and  right popliteal embolectomy, 12/21/05 by Dr. Madilyn Fireman.   SINGLE LEVEL ARTERIAL EXAM                               RIGHT              LEFT  Brachial:                    130                128  Anterior tibial:             80                 120  Posterior tibial:            96                 133  Peroneal:  Ankle/brachial index:        0.74               1.02   PREVIOUS ABI:  Date: 01/10/07  RIGHT:  0.78  LEFT:  0.96   LOWER EXTREMITY BYPASS GRAFT DUPLEX EXAM:   DUPLEX:  Normal Doppler waveforms noted proximal to, within, and distal  to the right popliteal embolectomy site.   IMPRESSION:  1. Patent right popliteal artery embolectomy site.  2. Stable ankle brachial indices bilaterally from previous study.      ___________________________________________  P. Liliane Bade, M.D.   AS/MEDQ  D:  03/11/2008  T:  03/11/2008  Job:  [id number]

## 2011-01-02 NOTE — Op Note (Signed)
NAMERYLIN, SAEZ                  ACCOUNT NO.:  [id number]   MEDICAL RECORD NO.:  [id number]          PATIENT TYPE:  AMB   LOCATION:  DSC                          FACILITY:  [facility]   PHYSICIAN:  Cindee Salt, M.D.       DATE OF BIRTH:  [birth year]   DATE OF PROCEDURE:  12/31/2008  DATE OF DISCHARGE:                               OPERATIVE REPORT   PREOPERATIVE DIAGNOSIS:  Carpal tunnel syndrome, left hand.   POSTOPERATIVE DIAGNOSIS:  Carpal tunnel syndrome, left hand.   OPERATION:  Decompression of left median nerve.   SURGEON:  Cindee Salt, MD   ASSISTANT:  Carolyne Fiscal.   ANESTHESIA:  IV regional with local infiltration.   ANESTHESIOLOGIST:  Dr. Jean Rosenthal.   HISTORY:  The patient is a 60 year old female with a history of carpal  tunnel syndrome, EMG nerve conductions positive.  This has not responded  to conservative treatment.  She has elected to undergo surgical  decompression.  Postoperative course discussed along with risks and  complications.  She is aware that there is no guarantee with the  surgery, possibility of infection, recurrence of injury to arteries,  nerves, tendons, complete relief of symptoms, dystrophy.  Preoperative  area, the patient is seen.  The extremity marked by both the patient and  surgeon.  Antibiotic given.   PROCEDURE:  The patient was brought to the operating room where a  forearm-based IV regional anesthetic was carried out without difficulty.  She was prepped using ChloraPrep, supine position, left arm free.  After  adequate anesthesia was afforded, time-out taken confirming the patient  position, allowing a 3-minute dry time for the ChloraPrep, she was  draped.  A longitudinal incision was made in the palm, carried down  through subcutaneous tissue.  Bleeders were electrocauterized, palmar  fascia was split, superficial palmar arch identified, the flexor tendon  to the ring little finger identified to the ulnar side of median nerve.  Carpal  retinaculum was incised with sharp dissection.  Right angle and  Sewall retractor were placed between skin and forearm fascia.  The  fascia was released for approximately a centimeter and a half proximal  to the wrist crease under direct vision.  The canal was explored.  Area  compression to the nerve was apparent.  Tenosynovium tissue was  moderately thickened.  The wound was copiously irrigated with saline.  The skin closed with interrupted 4-0 Vicryl Rapide sutures.  A sterile  compressive  dressing and splint to the wrist was applied.  The patient tolerated the  procedure well and was taken to the recovery room for observation in  satisfactory condition.  She will be discharged home to return to the  [facility] [place] in 1 week on Vicodin.           ______________________________  Cindee Salt, M.D.     GK/MEDQ  D:  12/31/2008  T:  01/01/2009  Job:  [id number]   cc:   Reuben Likes, M.D.

## 2011-01-02 NOTE — Assessment & Plan Note (Signed)
[facility]                         GASTROENTEROLOGY OFFICE NOTE   Sonia, Dawson                         MRN:          [id number]  DATE:09/19/2007                            DOB:          [birth year]    REFERRING PHYSICIAN:  Garnetta Buddy, M.D.   REASON FOR CONSULTATION:  Abnormal liver tests.   Sonia Dawson is a pleasant, 60 year old white female, referred through the  courtesy of Dr. Hyman Hopes for evaluation.  Routine testing demonstrated  abnormal liver tests.  On August 19, 2007, her AST was 49 and ALT was  57.  Alkaline phosphatase and bilirubin were normal.  Ms. Sonia Dawson has no GI  complaints, including abdominal pain, history of hepatitis, alcohol or  drug use.  She has rare pyrosis, which is well-controlled with Prevacid.  She underwent screening colonoscopy six years ago that apparently was  negative.   PAST MEDICAL HISTORY:  Pertinent for thyroid disease and hypertension.  She has had surgery to remove a blood clot, associated with a cardiac  catheterization a year and a half ago.  She has had nasal reconstruction  surgery.   FAMILY HISTORY:  Pertinent for both parents with heart disease.   MEDICATIONS:  Include Synthroid, Buspirone, HCTZ, clonazepam,  sertraline, metoprolol, and Prevacid and baby aspirin.   ALLERGIES:  She is allergic to ERYTHROMYCIN and SULFA.   SOCIAL HISTORY:  She smokes a quarter pack a day.  She drinks rarely.  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   REVIEW OF SYSTEMS:  Was reviewed and is positive for urinary changes,  feet swelling, sleeping problems and back pain.   PHYSICAL EXAM:  She is a healthy-appearing female.  There are no  stigmata of liver disease.  Pulse 70, blood pressure 132/72, weight 263.  HEENT: EOMI.  PERRLA.  Sclerae are anicteric.  Conjunctivae are pink.  NECK:  Supple without thyromegaly, adenopathy or carotid bruits.  CHEST:  Clear to auscultation and percussion  without adventitious  sounds.  CARDIAC:  Regular rhythm; normal S1 S2.  There are no murmurs, gallops  or rubs.  ABDOMEN:  Bowel sounds are normoactive.  Abdomen is soft, nontender and  nondistended.  There are no abdominal masses, tenderness, splenic  enlargement or hepatomegaly.  EXTREMITIES:  Full range of motion.  No cyanosis, clubbing or edema.  RECTAL:  Deferred.   IMPRESSION:  Mild liver function test abnormalities.  I strongly suspect  this is due to hepatic steatosis.  Chronic hepatitis is less likely.   RECOMMENDATIONS:  1. Abdominal ultrasound.  2. Check serologies for hepatitis B and C, alpha 1 antitrypsin level,      ceruloplasmin levels and iron studies.     Barbette Hair. Arlyce Dice, MD,FACG  Electronically Signed    RDK/MedQ  DD: 09/19/2007  DT: 09/19/2007  Job #: [id number]   cc:   Reuben Likes, M.D.  Garnetta Buddy, M.D.

## 2011-01-02 NOTE — Procedures (Signed)
AORTA-ILIAC DUPLEX EVALUATION   INDICATION:  Followup, bilateral common iliac artery stents.   HISTORY:  Diabetes:  No.  Cardiac:  No.  Hypertension:  Yes.  Smoking:  Yes.  Previous Surgery:  Right and left common iliac artery PTA/stent and  right popliteal embolectomy, 12/21/05 by Dr. Madilyn Fireman.               SINGLE LEVEL ARTERIAL EXAM                              RIGHT                  LEFT  Brachial:                  130                    128  Anterior tibial:           80                     120  Posterior tibial:          96                     133  Peroneal:  Ankle/brachial index:      0.74                   1.02  Previous ABI/date:         01/10/07, 0.78         01/10/07, 0.96   AORTA-ILIAC DUPLEX EXAM  Aorta - Proximal     84 Cm/s  Aorta - Mid          103 cm/s  Aorta - Distal       131 cm/s   RIGHT                                   LEFT  188 cm/s          CIA-PROXIMAL          123 cm/s  187 cm/s          CIA-DISTAL            126 cm/s  Unable to visualize                     HYPOGASTRIC       Unable to  visualize  154 cm/s          EIA-PROXIMAL          100 cm/s  135 cm/s          EIA-MID               92 cm/s  149 cm/s          EIA-DISTAL            112 cm/s   IMPRESSION:  1. Aorta iliac system appears patent with no evidence of focal      stenosis.  2. Note:  A technically difficult study due to body habitus, vessel      depth, and bowel gas.   ___________________________________________  P. Liliane Bade, M.D.   AS/MEDQ  D:  03/11/2008  T:  03/11/2008  Job:  [id number]

## 2011-01-02 NOTE — Procedures (Signed)
AORTA-ILIAC DUPLEX EVALUATION   INDICATION:  Follow up bilateral common iliac artery stents.   HISTORY:  Diabetes:  No.  Cardiac:  No.  Hypertension:  Yes.  Smoking:  Yes.  Previous Surgery:  Bilateral common iliac artery PTA and stents with a  right popliteal artery embolectomy on 12/21/05.               SINGLE LEVEL ARTERIAL EXAM                              RIGHT                  LEFT  Brachial:                  168                    166  Anterior tibial:           120                    163  Posterior tibial:          133                    172  Peroneal:  Ankle/brachial index:      0.79                   1.02  Previous ABI/date:         03/11/08, 0.74         03/11/08, 1.02   AORTA-ILIAC DUPLEX EXAM  Aorta - Proximal     Not visualized  Aorta - Mid          Not visualized  Aorta - Distal       80 cm/s   RIGHT                                   LEFT  133 cm/s          CIA-PROXIMAL          119 cm/s  238 cm/s          CIA-DISTAL            249 cm/s  Not visualized    HYPOGASTRIC           Not visualized  158 cm/s          EIA-PROXIMAL          132 cm/s  140 cm/s          EIA-MID               138 cm/s  127 cm/s          EIA-DISTAL            124 cm/s   IMPRESSION:  1. Patent bilateral common iliac artery stents with a significant      increase in the velocities of the bilateral common iliac arteries      noted when compared to the previous examination on 03/11/08.  2. The right popliteal artery is patent with biphasic Doppler      waveforms noted.  3. Stable bilateral ankle brachial indices noted.  4. Limited visualization of the aortoiliac system due to patient body      habitus and overlying bowel gas patterns.  ___________________________________________  P. Liliane Bade, M.D.   CH/MEDQ  D:  10/14/2008  T:  10/14/2008  Job:  [id number]

## 2011-01-05 NOTE — Cardiovascular Report (Signed)
NAMESHANERA, Sonia Dawson                  ACCOUNT NO.:  [id number]   MEDICAL RECORD NO.:  [id number]          PATIENT TYPE:  INP   LOCATION:  2003                         FACILITY:  [facility]   PHYSICIAN:  Sonia Hilts. Jacinto Halim, MD       DATE OF BIRTH:  [birth year]   DATE OF PROCEDURE:  12/28/2004  DATE OF DISCHARGE:                              CARDIAC CATHETERIZATION   REFERRING PHYSICIAN:  Dr. Leslee Dawson   PROCEDURES PERFORMED:  1.  Left ventriculography.  2.  Selective right and left coronary arteriography.  3.  Ascending aortogram.  4.  Abdominal aortogram.   INDICATIONS:  Sonia Dawson is a 60 year old female with strong family history of  premature coronary artery disease, history of hypertension, smoking, obesity  who presented to the emergency room complaining of chest pain associated  with shortness of breath and diaphoresis.  Pertinent for atypical history  for unstable angina and also given her ongoing chest discomfort she was  admitted to the hospital.  Myocardial infarction was ruled out and she was  brought to the cardiac catheterization laboratory for a definite diagnosis  of coronary disease.   HEMODYNAMIC DATA:  The left ventricular pressure was 146/5 with an end-  diastolic pressure of 18 mmHg.  The aortic pressure was 142/75 with a mean  of 102 mmHg.  There was no significant pressure gradient across the aortic  valve.   ANGIOGRAPHIC DATA:  Left ventricle:  Left ventricular systolic function was  normal.  Ejection fraction was estimated at 65%.  There was no significant  mitral regurgitation.   Right coronary artery:  Right coronary artery is a large caliber vessel,  dominant vessel.  It gives origin to large PLA and a PDA.  It is normal.   Left main:  Left main is a large caliber vessel.  It is normal.   Circumflex:  Circumflex is a large caliber vessel.  Continues as a large  obtuse marginal 1.  It is normal.   Left anterior descending:  Left anterior descending is a large  caliber  vessel.  It has minimal luminal irregularity in the mid segment, but no  significant luminal obstruction.  Gives origin to small to moderate sized  diagonal 1 and diagonal 2.  Otherwise, the LAD appears normal, but is  tortuous.   ASCENDING AORTOGRAM:  Ascending aortogram revealed presence of three aortic  valve cusps.  There is no evidence of aortic dissection.  There is no  evidence of ascending aortic aneurysm.   ABDOMINAL AORTOGRAM:  Abdominal aortogram revealed presence of two renal  arteries, one on either side.  They are widely patent.  There is no evidence  of abdominal aortic aneurysm.   IMPRESSION:  1.  Normal left ventricular systolic function, ejection fraction 65%.  2.  Normal coronary arteries except for minimal luminal irregularity in the      mid left anterior descending.  No evidence of thrombus.  No evidence of      unstable lesions.  No haziness.  No evidence of dissection.   RECOMMENDATIONS:  Chest pain evaluation for  noncardiac causes is indicated.  Given her history the second option probably suggested GERD.  Hence,  recommendation for primary prevention with risk factor modification with  aggressive control of hypertension, hyperlipidemia, smoking cessation, and  continued exercise and weight loss is indicated.  Patient will follow up  with Sonia Dawson.   TECHNIQUE OF PROCEDURE:  Under the usual sterile precaution using a 6-French  right femoral artery access, 6-French multipurpose B2 catheter was advanced  through the ascending aorta with 0.03 [place] J-wire.  The catheter was  gently advanced into the left ventricle.  Left ventricular pressures were  monitored.  Hand contrast injection of the left ventricle was performed both  in LAO and RAO projection.  The catheter was flushed with saline, pulled  back into the ascending aorta and pressure gradient across the aortic valve  was monitored.  Then the right coronary artery was selectively engaged  and  angiography was performed.  In the similar fashion left main coronary artery  was selectively engaged.  Angiography was performed.  Then the catheter was  pulled back in the root of the aorta and aortic root was visualized in the  LAO projection.  Then the catheter was gently pulled back in abdominal aorta  and abdominal aortogram was performed.  Then the catheter was pulled out of  the body in the usual fashion.  Right femoral angiography was performed  through the arterial access and the access was closed with Starclose with  excellent hemostasis obtained.  Patient tolerated procedure well.  A total  of 90 mL of contrast was utilized for diagnostic angiography.      JRG/MEDQ  D:  12/28/2004  T:  12/28/2004  Job:  [id number]   cc:   Sonia Dawson, M.D.  [address]  [place]  [place] [address]  Fax: [phone]

## 2011-01-05 NOTE — Discharge Summary (Signed)
NAMETAIMI, TOWE                  ACCOUNT NO.:  [id number]   MEDICAL RECORD NO.:  [id number]          PATIENT TYPE:  INP   LOCATION:  2003                         FACILITY:  [facility]   PHYSICIAN:  Cristy Hilts. Jacinto Halim, MD       DATE OF BIRTH:  12-29-1950   DATE OF ADMISSION:  12/27/2004  DATE OF DISCHARGE:  12/28/2004                                 DISCHARGE SUMMARY   DISCHARGE DIAGNOSIS:  1.  Chest pain consistent with unstable angina, catheterization this      admission revealing normal coronaries.  2.  Normal left ventricular function.  3.  Hypertension.  4.  Hyperlipidemia.  5.  History of smoking.  6.  Hypothyroidism, Synthroid was adjusted this admission.  7.  Obesity.   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  She was admitted to telemetry.  We initially tried to cath her on May 10 but  there was an emergency and it had to be put off until May 11.  She was put  on Lovenox.  Labs are unremarkable except for TSH of 0.102.  Her Synthroid  was cut back a little.  She will follow up with Dr. Lorenz Coaster regarding this.  Catheterization was done Dec 28, 2004, by Dr. Jacinto Halim which revealed no  significant coronary disease and good LV function.  She is encouraged to  stop smoking and will continue with her treatment for hypertension and  dyslipidemia.   DISCHARGE MEDICATIONS:  Zoloft 50 mg daily, Wellbutrin 300 mg daily, Toprol  XL 50 mg daily, Synthroid 0.112 mg daily, hydrochlorothiazide 25 mg daily,  Klonopin 0.5 mg daily, and Vytorin as taken prior to admission.   LABORATORY DATA:  White count 5.1, hemoglobin 14.3, hematocrit 41.7,  platelets 189, sodium 138, potassium 3.5, BUN 16, creatinine 1, glucose 85.  Liver function tests are normal.  Troponins are negative x 2.  TSH as noted  is 0.0102.  BNP 37.  Chest  x-ray shows no evidence of acute disease.  Urinalysis unremarkable.  INR 0.9.   DISPOSITION:  The patient is discharged in stable condition and will follow  up with Dr. Jacinto Halim in the office in a couple of weeks.  She will see Dr.  Lorenz Coaster for follow up of her thyroid tests.  She is instructed not to return  to work until  Jan 02, 2004.      LKK/MEDQ  D:  12/28/2004  T:  12/28/2004  Job:  [id number]   cc:   Reuben Likes, M.D.  [address]  [place]  [place] [address]  Fax: [phone]

## 2011-01-05 NOTE — Procedures (Signed)
[place]  Patient:    Sonia Dawson, Sonia Dawson                           MRN: [id number]. Date: 01/27/01 Attending:  Verlin Grills, M.D. CC:         Beather Arbour. Thomasena Edis, M.D.  Gretta Arab Valentina Lucks, M.D.   Procedure Report  DATE OF BIRTH:  01-Feb-1951.  REFERRING PHYSICIAN:  Lafonda Mosses B. Thomasena Edis, M.D.  PROCEDURE PERFORMED:  Colonoscopy.  ENDOSCOPIST:  Verlin Grills, M.D.  INDICATIONS FOR PROCEDURE:  The patient is a 60 year old female who is referred by Dr. Jeri Modena.  She recently underwent her health maintenance gynecologic  exam performed by Dr. Artist Pais and submitted stool for hemoccult testing.  Her stool slides were positive for blood.  Ms. Hudman denies a personal or family history of colon cancer.  The patient viewed our colonoscopy education film.  I discussed with her the complications associated with colonoscopy and polypectomy including a 15 per 1000 risk of bleeding and 4 per 1000 risk of colon rupture requiring emergency surgery. Ms. Bach has signed the operative permit.  ALLERGIES:  SULFA.  CURRENT MEDICATIONS:  Synthroid, Toprol, Wellbutrin, Aciphex.  PAST MEDICAL HISTORY:  Hypertension, nasal reconstruction surgery, ganglion cyst removal, tubal ligation, depression, hypothyroidism.  PREMEDICATION:  Versed 7.5 mg, fentanyl 50 mcg.  ENDOSCOPE:  Olympus pediatric colonoscope.  DESCRIPTION OF PROCEDURE:  After obtaining informed consent, the patient was placed in the left lateral decubitus position.  I administered intravenous fentanyl and intravenous Versed to achieve conscious sedation for the procedure.  The patients blood pressure, oxygen saturation and cardiac rhythm were monitored throughout the procedure and documented in the medical record.  Anal inspection was normal.  Digital rectal exam was normal.  The Olympus pediatric video colonoscope was then introduced into the rectum and under direct  vision, advanced to the cecum.  Colonic preparation for the exam today was excellent.  Rectum:  Normal.  Sigmoid colon and descending colon:  Normal.  Splenic flexure:  Normal.  Transverse colon:  Normal.  Hepatic flexure:  Normal.  Ascending colon:  Normal.  Cecum and ileocecal valve:  Normal.  ASSESSMENT:  Normal proctocolonoscopy to the cecum. DD:  01/27/01 TD:  01/27/01 Job: [id number]

## 2011-01-05 NOTE — Consult Note (Signed)
Sonia Dawson, Sonia Dawson                  ACCOUNT NO.:  [id number]   MEDICAL RECORD NO.:  [id number]          PATIENT TYPE:  EMS   LOCATION:  MAJO                         FACILITY:  [facility]   PHYSICIAN:  Di Kindle. Edilia Bo, M.D.DATE OF BIRTH:  1951-03-09   DATE OF CONSULTATION:  12/20/2005  DATE OF DISCHARGE:  12/20/2005                                   CONSULTATION   REASON FOR CONSULTATION:  Paresthesias in the right foot.   HISTORY:  This is a pleasant 60 year old woman who has been having some  problems intermittently with the right leg.  She had a cyst removed from the  right calf in October 2006.  She states that around that time she had  started to develop some claudication in the right calf with ambulating  approximately one block.  These symptoms have been fairly persistent.  Her  symptoms did not occur at rest.  I did not get any classic history of rest  pain in the right foot.  She has had no history of nonhealing ulcers.  She  states that she has had some problems with burning in the right calf off and  on since the beginning of this year.  Five days ago she noted that her calf  felt like it was on fire.  The next day she noted that the foot with  slightly cool and she developed some paresthesias in the right foot which  have been persistent.  She feels that the right foot is cooler than the left  foot.  She works at [employer] and Dr. Hyman Hopes asked [place] to  evaluate the patient for a possible ischemic right leg.   This patient has had no history of arrhythmias, previous myocardial  infarction.  She had been admitted with some chest pain last year and heart  catheterization at that time showed normal left ventricular systolic  function with an ejection fraction of 65%.  No abdominal aortic aneurysm was  noted.   PAST MEDICAL HISTORY:  Significant for hypertension, hypercholesterolemia,  and hypothyroidism.  In addition she has history of obesity.  She denies  any  history of diabetes, prior history of myocardial infarction, history of  congestive heart failure, or history of COPD.  On her heart catheterization  she did not have any significant coronary disease.   PAST SURGICAL HISTORY:  She has had a cyst removed from the right leg and  also a ganglion cyst removed from the left hand.   MEDICATIONS:  1.  Zoloft 75 mg p.o. daily.  2.  Wellbutrin 300 mg p.o. daily.  3.  Toprol-XL 50 mg p.o. daily.  4.  Synthroid 0.125 mg p.o. daily.  5.  Hydrochlorothiazide 25 mg p.o. daily.  6.  Klonopin 0.5 mg p.o. t.i.d.   ALLERGIES:  SULFA and ERYTHROMYCIN.   SOCIAL HISTORY:  She is divorced.  She has two children, a son and a  daughter.  She quit tobacco in February of this year.  She had been smoking  approximately seven cigarettes a day for 30 years.   FAMILY HISTORY:  Her mother had a heart attack in her 56s although she is  currently still alive and doing well.  Her father is alive and well and he  has had a previous AVR and cabbage.  She is unaware of any other history of  premature cardiovascular disease.  She is unaware of any history of  aneurysmal disease in the family except that her grandmother apparently had  an intracranial aneurysm.   REVIEW OF SYSTEMS:  She has had no recent chest pain, chest pressure,  palpitations or arrhythmias.  She has had no bronchitis, asthma or wheezing.  She has had no recent change in her bowel habits or history of peptic ulcer  disease.  She does have some problems with urinary incontinence.  She has  had no history of DVT or phlebitis.  She has had no history of stroke, TIAs,  expressive or receptive aphasia, or amaurosis fugax.  Review of systems is  otherwise unremarkable.   PHYSICAL EXAMINATION:  VITAL SIGNS:  Blood pressure is 116/62, heart rate is  65.  HEENT:  I do not detect any carotid bruits.  LUNGS:  Clear bilaterally to auscultation.  CARDIAC:  She has a regular rate and rhythm.  ABDOMEN:   Is obese and difficult to assess for an aneurysm.  EXTREMITIES:  She has palpable femoral pulses although this was somewhat  difficult to palpate because of her obesity.  I cannot palpate the popliteal  pulses.  She does have brisk biphasic Doppler signals in the left foot with  monophasic Doppler signals in the right foot.  There is normal capillary  refill bilaterally.  The right foot and left foot are approximately equal  temperature.  There is no mottling of the right foot.  Motor and sensory  function is intact on the right.   She did have a Doppler study at [facility] which showed monophasic Doppler  signals the right foot with an ABI of 66%.  ABI on the left side was 100%.  Duplex imaging revealed mixed plaque throughout the popliteal artery which  appeared occluded with some collateral filling.   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  Her symptoms appear to have worsened  somewhat 5 days ago and it is possible that she went on to thrombose a  chronically-diseased popliteal artery, although there is no evidence of a  popliteal artery aneurysm and she has no reason why she would have had an  event.  Currently the foot is well perfused, however.  We will plan on  arteriography tomorrow to further assess this.  If her symptoms are  tolerable and she has chronic disease we could simply follow this for now.  If her symptoms progress we would have to consider revascularization.  Will  make further recommendations pending the results of her arteriogram.      Di Kindle. Edilia Bo, M.D.  Electronically Signed     CSD/MEDQ  D:  12/20/2005  T:  12/21/2005  Job:  [id number]

## 2011-01-05 NOTE — H&P (Signed)
Sonia Dawson, GRAVES                  ACCOUNT NO.:  [id number]   MEDICAL RECORD NO.:  [id number]          PATIENT TYPE:  INP   LOCATION:  2003                         FACILITY:  [facility]   PHYSICIAN:  Cristy Hilts. Jacinto Halim, MD       DATE OF BIRTH:  06-17-1951   DATE OF ADMISSION:  12/27/2004  DATE OF DISCHARGE:                                HISTORY & PHYSICAL   CHIEF COMPLAINT:  Chest tightness.   HISTORY OF PRESENT ILLNESS:  Patient is a 60 year old female with no prior  history of coronary disease or MI.  She is admitted through the emergency  room with chest pain.  She describes a mid sternal chest tightness and gives  a positive Levine's sign.  Her symptoms started early this morning and waxed  and waned.  She had no change with Tums.  She presented to the emergency  room, received nitroglycerin, is now pain free.  She did have some radiation  to her left shoulder, but no associated shortness of breath, no nausea or  vomiting.   PAST MEDICAL HISTORY:  1.  Hypertension.  2.  Hyperlipidemia.  3.  Hypothyroidism.  4.  Past history of depression.  5.  She had a rhinoplasty in the 1970s, but no other major surgeries.   CURRENT MEDICATIONS:  1.  Zoloft 50 mg a day.  2.  Wellbutrin 300 mg a day.  3.  Toprol XL 50 mg a day.  4.  Synthroid 0.137 mg a day.  5.  Hydrochlorothiazide 25 mg a day.  6.  Klonopin 0.5 mg.  7.  Vytorin.   ALLERGIES:  SULFA, ERYTHROMYCIN.   SOCIAL HISTORY:  She is divorced.  She smokes less than a half pack a day.  She has two children, four grandchildren.  She works as a Water quality scientist at  [employer].   FAMILY HISTORY:  Her father is alive at 19 and had a bypass AVR in his 34s.  Her mother is alive at 69 with a history of coronary disease in her 96s.   REVIEW OF SYSTEMS:  Essentially unremarkable except for noted above.  She  denies any fever or chills, unusual dyspnea or orthopnea.  She has no  history of GI bleeding or renal disease.   PHYSICAL  EXAMINATION:  VITAL SIGNS:  Blood pressure 118/65, pulse 67,  temperature 98.1.  GENERAL:  She is a well-developed, obese female in no acute distress.  HEENT:  Normocephalic.  Extraocular movements are intact.  Sclera is  nonicteric.  She does wear glasses.  NECK:  Without bruit, JVD.  CHEST:  Clear to auscultation, percussion.  CARDIAC:  Regular rate and rhythm without murmurs, rubs, or gallops.  Normal  S1, S2.  ABDOMEN:  Nontender.  Bowel sounds are present.  EXTREMITIES:  Without edema.  She has intact distal pulses.  NEUROLOGIC:  Grossly intact.   EKG shows sinus rhythm with Q-wave in V2.  Laboratories are pending.   IMPRESSION:  1.  Unstable angina.  2.  Hypertension.  3.  Hyperlipidemia.  4.  History of smoking.  5.  Obesity.  6.  History of depression.  7.  Treated hypothyroidism.   PLAN:  She was seen by Dr. Jacinto Halim and myself today and she will be admitted,  started on Lovenox and nitrates and will try to catheterize her later today.      LKK/MEDQ  D:  12/28/2004  T:  12/28/2004  Job:  [id number]   cc:   Reuben Likes, M.D.  [address]  [place]  [place] [address]  Fax: [phone]

## 2011-01-30 ENCOUNTER — Encounter: Payer: Self-pay | Admitting: Internal Medicine

## 2011-01-31 ENCOUNTER — Encounter: Payer: Self-pay | Admitting: Internal Medicine

## 2011-01-31 ENCOUNTER — Ambulatory Visit (INDEPENDENT_AMBULATORY_CARE_PROVIDER_SITE_OTHER): Payer: BC Managed Care – PPO | Admitting: Internal Medicine

## 2011-01-31 DIAGNOSIS — I10 Essential (primary) hypertension: Secondary | ICD-10-CM

## 2011-01-31 DIAGNOSIS — E039 Hypothyroidism, unspecified: Secondary | ICD-10-CM

## 2011-01-31 DIAGNOSIS — K7689 Other specified diseases of liver: Secondary | ICD-10-CM

## 2011-01-31 LAB — COMPREHENSIVE METABOLIC PANEL
ALT: 45 U/L — AB (ref 7–35)
Albumin/Globulin Ratio: 1.4
Albumin: 4.1
Calcium: 9.5 mg/dL
EGFR: 53 mg/dL
Globulin, Total: 3
Phosphorus: 4.7 mg/dL (ref 2.5–4.9)
Potassium, serum: 4.4
Protein, Total: 7.1
Total Bilirubin: 0.4 mg/dL

## 2011-01-31 LAB — CBC WITH DIFFERENTIAL/PLATELET
Basophils Absolute: 0 /uL
Basos: 0
EOS: 4 %
Eosinophils Absolute: 0 /uL
HCT: 38 %
MCH: 27.9
MCV: 82
Monocytes Absolute: 1 /uL
Monocytes: 10
Platelets: 190
RBC: 4.63
WBC: 5

## 2011-01-31 LAB — THYROID PANEL WITH TSH - CHCC
Free Thyroxine Index: 3.8
T3 Uptake: 29
T4, Total: 13
TSH: 0.558

## 2011-01-31 NOTE — Progress Notes (Signed)
  Subjective:    Patient ID: Sonia Dawson, female    DOB: 1951-01-09, 60 y.o.   MRN: [id number]  Hypertension This is a chronic problem. The current episode started more than 1 year ago. The problem is unchanged. The problem is uncontrolled. Associated symptoms include anxiety. Pertinent negatives include no blurred vision, chest pain, headaches, malaise/fatigue, neck pain, orthopnea, palpitations, peripheral edema, PND, shortness of breath or sweats. Past treatments include diuretics and beta blockers. The current treatment provides moderate improvement. Compliance problems include diet and exercise.       Review of Systems  Constitutional: Negative for fever, chills, malaise/fatigue, diaphoresis, activity change, appetite change, fatigue and unexpected weight change.  HENT: Negative for facial swelling, neck pain and neck stiffness.   Eyes: Negative.  Negative for blurred vision.  Respiratory: Negative.  Negative for apnea, cough, chest tightness, shortness of breath, wheezing and stridor.   Cardiovascular: Negative for chest pain, palpitations, orthopnea, leg swelling and PND.  Gastrointestinal: Negative.   Musculoskeletal: Negative.   Skin: Negative.   Neurological: Negative.  Negative for headaches.  Hematological: Negative.   Psychiatric/Behavioral: Negative.        Objective:   Physical Exam  Vitals reviewed. Constitutional: She is oriented to person, place, and time. She appears well-developed and well-nourished. No distress.  HENT:  Head: Normocephalic and atraumatic.  Right Ear: External ear normal.  Left Ear: External ear normal.  Nose: Nose normal.  Mouth/Throat: Oropharynx is clear and moist. No oropharyngeal exudate.  Eyes: Conjunctivae and EOM are normal. Pupils are equal, round, and reactive to light. Right eye exhibits no discharge. Left eye exhibits no discharge. No scleral icterus.  Neck: Normal range of motion. Neck supple. No JVD present. No tracheal deviation  present. No thyromegaly present.  Cardiovascular: Normal rate, regular rhythm, normal heart sounds and intact distal pulses.  Exam reveals no gallop and no friction rub.   No murmur heard. Pulmonary/Chest: Effort normal and breath sounds normal. No stridor. No respiratory distress. She has no wheezes. She has no rales. She exhibits no tenderness.  Abdominal: Soft. Bowel sounds are normal. She exhibits no distension and no mass. There is no tenderness. There is no rebound and no guarding.  Musculoskeletal: Normal range of motion. She exhibits no edema and no tenderness.  Lymphadenopathy:    She has no cervical adenopathy.  Neurological: She is alert and oriented to person, place, and time. She has normal reflexes. No cranial nerve deficit.  Skin: Skin is warm and dry. No rash noted. She is not diaphoretic. No erythema. No pallor.  Psychiatric: She has a normal mood and affect. Her behavior is normal. Judgment and thought content normal.         Lab Results  Component Value Date   WBC 7.4 09/12/2010   HGB 14.6 09/12/2010   HCT 42.6 09/12/2010   PLT 241 09/12/2010   ALT 43 09/12/2010   AST 42 09/12/2010   NA 140 09/12/2010   K 4.3 09/12/2010   CL 103 09/12/2010   CREATININE 0.98 09/12/2010   BUN 16 09/12/2010   CO2 32 09/12/2010   TSH 0.325 09/12/2010   INR 0.9 RATIO 09/19/2007   HGBA1C 6.0 12/31/2008   Assessment & Plan:

## 2011-01-31 NOTE — Assessment & Plan Note (Signed)
This is unchanged 

## 2011-01-31 NOTE — Assessment & Plan Note (Signed)
I have added benicar for better BP control and for renal protection

## 2011-01-31 NOTE — Assessment & Plan Note (Signed)
Her recent TSH is in the normal range 

## 2011-02-18 ENCOUNTER — Other Ambulatory Visit: Payer: Self-pay | Admitting: Internal Medicine

## 2011-02-19 ENCOUNTER — Telehealth: Payer: Self-pay

## 2011-02-19 DIAGNOSIS — I10 Essential (primary) hypertension: Secondary | ICD-10-CM

## 2011-02-19 MED ORDER — HYDROCHLOROTHIAZIDE 25 MG PO TABS: 25.0000 mg | ORAL_TABLET | Freq: Every day | ORAL | Status: DC

## 2011-02-19 NOTE — Telephone Encounter (Signed)
Received refill request request to change HCTZ 25mg  rx to 90 day supply

## 2011-03-01 ENCOUNTER — Other Ambulatory Visit: Payer: Self-pay | Admitting: Internal Medicine

## 2011-04-05 ENCOUNTER — Ambulatory Visit (INDEPENDENT_AMBULATORY_CARE_PROVIDER_SITE_OTHER): Payer: BC Managed Care – PPO | Admitting: Internal Medicine

## 2011-04-05 ENCOUNTER — Encounter: Payer: Self-pay | Admitting: Internal Medicine

## 2011-04-05 VITALS — BP 132/88 | HR 62 | Temp 97.6°F | Resp 16 | Wt 277.0 lb

## 2011-04-05 DIAGNOSIS — F411 Generalized anxiety disorder: Secondary | ICD-10-CM

## 2011-04-05 DIAGNOSIS — F419 Anxiety disorder, unspecified: Secondary | ICD-10-CM

## 2011-04-05 DIAGNOSIS — I10 Essential (primary) hypertension: Secondary | ICD-10-CM

## 2011-04-05 DIAGNOSIS — E039 Hypothyroidism, unspecified: Secondary | ICD-10-CM

## 2011-04-05 MED ORDER — ALPRAZOLAM 0.5 MG PO TABS: 0.5000 mg | ORAL_TABLET | Freq: Three times a day (TID) | ORAL | Status: DC | PRN

## 2011-04-05 NOTE — Patient Instructions (Signed)
Grief Reaction     You are having a grief reaction which is a normal response to loss.  Feelings of fear, anger, and guilt can affect almost everyone who loses someone they love.  Symptoms of depression are also common.  These include problems with sleep, loss of appetite, and lack of energy.  These grief reaction symptoms often last for weeks to months after a loss.  They may also return in the future, as with reactions to an anniversary or birthday.     A grief reaction can become complicated by being blocked.  This means being unable to cry or express extreme upset or sadness may prolong and worsen the effects of the loss.  It is very important to share your sorrow and fear with others, especially close friends and family.  Professional counselors and clergy can also help you process your grief.     Anxiety, insomnia, irritability, and deep depression may last beyond the period of normal grief. This is up to 6 months.  This requires further medical attention. It means that you have developed a clinical depression. If you have a history of depression and or a family history of depression, you are at greater risk.  So are those for whom the loss was traumatic or the loss was of someone with whom there were unresolved issues.      Mourning is a natural event in human life.  A healthy grief reaction is one that is not blocked by drug sedation or psychological denial. This requires a time of sadness and readjustment.  Prolonged use of tranquilizers, sleeping pills, or alcohol overuse should be avoided.      Call your caregiver if you need further help.     Document Released: 08/06/2005  Document Re-Released: 10/31/2009  [employer] Patient Information 2011 [employer].

## 2011-04-05 NOTE — Assessment & Plan Note (Signed)
Recent TSH is normal 

## 2011-04-05 NOTE — Assessment & Plan Note (Signed)
BP is well controlled 

## 2011-04-05 NOTE — Progress Notes (Signed)
Subjective:    Patient ID: Sonia Dawson, female    DOB: [birth year], 60 y.o.   MRN: [id number]  HPI She returns for f/up and tells me that she if grieving for her mother who has had several strokes and is not doing very well. She feels anxious and has crying spells. She has been taking her sister's xanax and she likes that better than klonopin and she wants to change to xanax.    Review of Systems  Constitutional: Negative for fever, chills, diaphoresis, activity change, appetite change, fatigue and unexpected weight change.  HENT: Negative.   Eyes: Negative.   Respiratory: Negative for apnea, cough, choking, chest tightness, shortness of breath, wheezing and stridor.   Cardiovascular: Negative for chest pain, palpitations and leg swelling.  Gastrointestinal: Negative for nausea, abdominal pain, diarrhea, constipation, blood in stool and abdominal distention.  Genitourinary: Negative.   Musculoskeletal: Negative for myalgias, back pain, joint swelling, arthralgias and gait problem.  Skin: Negative for color change, pallor, rash and wound.  Neurological: Negative for dizziness, tremors, seizures, syncope, facial asymmetry, speech difficulty, weakness, light-headedness, numbness and headaches.  Hematological: Negative for adenopathy. Does not bruise/bleed easily.  Psychiatric/Behavioral: Positive for dysphoric mood and decreased concentration. Negative for suicidal ideas, hallucinations, behavioral problems, confusion, sleep disturbance, self-injury and agitation. The patient is nervous/anxious. The patient is not hyperactive.        Objective:   Physical Exam  Vitals reviewed. Constitutional: She is oriented to person, place, and time. She appears well-developed and well-nourished. No distress.  HENT:  Head: Normocephalic and atraumatic.  Right Ear: External ear normal.  Left Ear: External ear normal.  Nose: Nose normal.  Mouth/Throat: Oropharynx is clear and moist. No oropharyngeal  exudate.  Eyes: Conjunctivae and EOM are normal. Pupils are equal, round, and reactive to light. Right eye exhibits no discharge. Left eye exhibits no discharge. No scleral icterus.  Neck: Normal range of motion. Neck supple. No JVD present. No tracheal deviation present. No thyromegaly present.  Cardiovascular: Normal rate, regular rhythm, normal heart sounds and intact distal pulses.  Exam reveals no gallop and no friction rub.   No murmur heard. Pulmonary/Chest: Effort normal and breath sounds normal. No stridor. No respiratory distress. She has no wheezes. She has no rales. She exhibits no tenderness.  Abdominal: Soft. Bowel sounds are normal. She exhibits no distension and no mass. There is no tenderness. There is no rebound and no guarding.  Musculoskeletal: Normal range of motion. She exhibits no edema and no tenderness.  Lymphadenopathy:    She has no cervical adenopathy.  Neurological: She is alert and oriented to person, place, and time. She has normal reflexes.  Skin: Skin is warm and dry. No rash noted. She is not diaphoretic. No erythema. No pallor.  Psychiatric: Her behavior is normal. Judgment and thought content normal. Her mood appears anxious. Her affect is not angry, not blunt, not labile and not inappropriate. Her speech is not rapid and/or pressured, not delayed, not tangential and not slurred. She is not agitated, not aggressive, is not hyperactive, not slowed, not withdrawn, not actively hallucinating and not combative. Thought content is not paranoid and not delusional. Cognition and memory are normal. She exhibits a depressed mood. She expresses no homicidal and no suicidal ideation. She expresses no suicidal plans and no homicidal plans. She is communicative. She is attentive.     Lab Results  Component Value Date   WBC 5.0 01/29/2011   HGB 12.9 01/29/2011   HCT 38  01/29/2011   PLT 241 09/12/2010   ALT 45* 01/29/2011   AST 52 01/29/2011   NA 140 09/12/2010   K 4.3  09/12/2010   CL 103 09/12/2010   CREATININE 0.98 09/12/2010   BUN 13 01/29/2011   CO2 31 01/29/2011   TSH 0.558 01/29/2011   INR 0.9 RATIO 09/19/2007   HGBA1C 6.0 12/31/2008      Assessment & Plan:

## 2011-04-05 NOTE — Assessment & Plan Note (Signed)
Changed to xanax at her request

## 2011-04-17 ENCOUNTER — Other Ambulatory Visit: Payer: Self-pay | Admitting: Internal Medicine

## 2011-05-04 ENCOUNTER — Ambulatory Visit: Payer: BC Managed Care – PPO | Admitting: Internal Medicine

## 2011-05-21 ENCOUNTER — Ambulatory Visit (INDEPENDENT_AMBULATORY_CARE_PROVIDER_SITE_OTHER): Payer: BC Managed Care – PPO | Admitting: Internal Medicine

## 2011-05-21 ENCOUNTER — Encounter: Payer: Self-pay | Admitting: Internal Medicine

## 2011-05-21 VITALS — BP 204/120 | HR 81 | Temp 97.8°F | Resp 16 | Wt 268.0 lb

## 2011-05-21 DIAGNOSIS — I10 Essential (primary) hypertension: Secondary | ICD-10-CM

## 2011-05-21 DIAGNOSIS — E039 Hypothyroidism, unspecified: Secondary | ICD-10-CM

## 2011-05-21 MED ORDER — OLMESARTAN-AMLODIPINE-HCTZ 40-5-25 MG PO TABS: 1.0000 | ORAL_TABLET | Freq: Every day | ORAL | Status: DC

## 2011-05-21 NOTE — Assessment & Plan Note (Signed)
I think she needs an ARB to block the excess renin caused by diuretic use so I have consolidated her regimen to tribenzor + toprol

## 2011-05-21 NOTE — Progress Notes (Signed)
  Subjective:    Patient ID: Sonia Dawson, female    DOB: 1951-03-25, 60 y.o.   MRN: [id number]  Hypertension This is a chronic problem. The current episode started more than 1 year ago. The problem has been gradually worsening since onset. The problem is uncontrolled. Associated symptoms include blurred vision. Pertinent negatives include no anxiety, chest pain, headaches, malaise/fatigue, neck pain, orthopnea, palpitations, peripheral edema, PND, shortness of breath or sweats. There are no associated agents to hypertension. Past treatments include diuretics, beta blockers and calcium channel blockers. The current treatment provides no improvement. Compliance problems include exercise and diet.       Review of Systems  Constitutional: Negative for fever, chills, malaise/fatigue, diaphoresis, activity change, appetite change, fatigue and unexpected weight change.  HENT: Negative.  Negative for neck pain.   Eyes: Positive for blurred vision.  Respiratory: Negative for apnea, cough, choking, chest tightness, shortness of breath, wheezing and stridor.   Cardiovascular: Negative for chest pain, palpitations, orthopnea, leg swelling and PND.  Gastrointestinal: Negative for nausea, vomiting, abdominal pain, diarrhea, constipation, blood in stool, abdominal distention and anal bleeding.  Genitourinary: Negative for dysuria, urgency, frequency, hematuria, decreased urine volume, enuresis, difficulty urinating and dyspareunia.  Musculoskeletal: Negative for myalgias, back pain, joint swelling, arthralgias and gait problem.  Skin: Negative.   Neurological: Negative for dizziness, tremors, seizures, syncope, facial asymmetry, speech difficulty, weakness, light-headedness, numbness and headaches.  Hematological: Negative for adenopathy. Does not bruise/bleed easily.  Psychiatric/Behavioral: Negative.        Objective:   Physical Exam  Vitals reviewed. Constitutional: She is oriented to person, place,  and time. She appears well-developed and well-nourished. No distress.  HENT:  Mouth/Throat: Oropharynx is clear and moist. No oropharyngeal exudate.  Eyes: Conjunctivae are normal. Right eye exhibits no discharge. Left eye exhibits no discharge. No scleral icterus.  Neck: Normal range of motion. Neck supple. No JVD present. No tracheal deviation present. No thyromegaly present.  Cardiovascular: Normal rate, regular rhythm, normal heart sounds and intact distal pulses.  Exam reveals no gallop and no friction rub.   No murmur heard. Pulmonary/Chest: Effort normal and breath sounds normal. No stridor. No respiratory distress. She has no wheezes. She has no rales. She exhibits no tenderness.  Abdominal: Soft. Bowel sounds are normal. She exhibits no distension and no mass. There is no tenderness. There is no rebound and no guarding.  Musculoskeletal: Normal range of motion. She exhibits no edema and no tenderness.  Lymphadenopathy:    She has no cervical adenopathy.  Neurological: She is oriented to person, place, and time. She displays normal reflexes. No cranial nerve deficit. She exhibits normal muscle tone. Coordination normal.  Skin: Skin is warm and dry. No rash noted. She is not diaphoretic. No erythema. No pallor.  Psychiatric: She has a normal mood and affect. Her behavior is normal. Judgment and thought content normal.      Lab Results  Component Value Date   WBC 5.0 01/29/2011   HGB 12.9 01/29/2011   HCT 38 01/29/2011   PLT 241 09/12/2010   ALT 45* 01/29/2011   AST 52 01/29/2011   NA 140 09/12/2010   K 4.3 09/12/2010   CL 103 09/12/2010   CREATININE 0.98 09/12/2010   BUN 13 01/29/2011   CO2 31 01/29/2011   TSH 0.558 01/29/2011   INR 0.9 RATIO 09/19/2007   HGBA1C 6.0 12/31/2008      Assessment & Plan:

## 2011-05-21 NOTE — Patient Instructions (Signed)

## 2011-05-21 NOTE — Assessment & Plan Note (Signed)
Recent TSH looks good 

## 2011-06-12 ENCOUNTER — Ambulatory Visit (INDEPENDENT_AMBULATORY_CARE_PROVIDER_SITE_OTHER): Payer: BC Managed Care – PPO | Admitting: Internal Medicine

## 2011-06-12 ENCOUNTER — Encounter: Payer: Self-pay | Admitting: Internal Medicine

## 2011-06-12 VITALS — BP 118/72 | HR 80 | Temp 97.8°F | Resp 16 | Wt 268.8 lb

## 2011-06-12 DIAGNOSIS — M159 Polyosteoarthritis, unspecified: Secondary | ICD-10-CM

## 2011-06-12 MED ORDER — HYDROCODONE-ACETAMINOPHEN 5-500 MG PO TABS: 2.0000 | ORAL_TABLET | Freq: Four times a day (QID) | ORAL | Status: DC | PRN

## 2011-06-12 NOTE — Assessment & Plan Note (Signed)
Continue vicodin and f/up with Dr. Darrelyn Hillock her ortho doctor

## 2011-06-12 NOTE — Patient Instructions (Signed)

## 2011-06-12 NOTE — Progress Notes (Signed)
  Subjective:    Patient ID: Sonia Dawson, female    DOB: Jul 15, 1951, 60 y.o.   MRN: [id number]  Knee Pain  The incident occurred 5 to 7 days ago. The incident occurred at home. There was no injury mechanism. The pain is present in the right knee. The quality of the pain is described as aching. The pain is at a severity of 2/10. The pain is mild. The pain has been intermittent since onset. Pertinent negatives include no inability to bear weight, loss of motion, loss of sensation, muscle weakness, numbness or tingling. She reports no foreign bodies present. The symptoms are aggravated by movement and weight bearing. Treatments tried: her mother's vicodin supply. The treatment provided significant relief.      Review of Systems  Constitutional: Negative.   HENT: Negative.   Eyes: Negative.   Respiratory: Negative.   Cardiovascular: Negative.   Gastrointestinal: Negative.   Genitourinary: Negative.   Musculoskeletal: Positive for arthralgias. Negative for myalgias, back pain, joint swelling and gait problem.  Skin: Negative.   Neurological: Negative.  Negative for tingling and numbness.  Hematological: Negative.   Psychiatric/Behavioral: Negative.        Objective:   Physical Exam  Musculoskeletal:       Right knee: She exhibits deformity (crepitance and DJD changes). She exhibits normal range of motion, no swelling, no effusion, no ecchymosis, no laceration, no erythema, normal alignment, no LCL laxity, normal patellar mobility and no bony tenderness. no tenderness found.          Assessment & Plan:

## 2011-06-14 ENCOUNTER — Telehealth: Payer: Self-pay | Admitting: *Deleted

## 2011-06-14 NOTE — Telephone Encounter (Signed)
Spoke w/pt - she c/o severe knee pain, caused vomiting x 2. Vicodin eased pain some. I advised her to continue pain meds and call for ortho apt now - pt agreed

## 2011-06-19 ENCOUNTER — Telehealth: Payer: Self-pay

## 2011-06-19 NOTE — Telephone Encounter (Signed)
Please advise if ok to refill clonazepam 0.5mg  2tabs bid prn. Patient last seen 06/12/11, please advise if ok to refill

## 2011-06-20 MED ORDER — CLONAZEPAM 0.5 MG PO TABS: 0.5000 mg | ORAL_TABLET | Freq: Two times a day (BID) | ORAL | Status: DC | PRN

## 2011-06-20 NOTE — Telephone Encounter (Signed)
yes

## 2011-06-28 NOTE — H&P (Signed)
Sonia Dawson, Sonia Dawson NO.:  [id number]  MEDICAL RECORD NO.:  [id number]  LOCATION:  [facility]                        FACILITY:  [facility]  PHYSICIAN:  Georges Lynch. Gioffre, M.D.DATE OF BIRTH:  06/30/1951  DATE OF ADMISSION:  06/25/2011 DATE OF DISCHARGE:                             HISTORY & PHYSICAL   CHIEF COMPLAINT:  Right knee pain.  HISTORY OF PRESENT ILLNESS:  The patient is a 60 year old female with worsening right knee pain secondary to end-stage osteoarthritis.  The patient elected to have right total knee arthroplasty by Dr. Ranee Gosselin to decrease pain and increase function.  PAST MEDICAL HISTORY: 1. Hypertension. 2. Hypothyroidism. 3. History of a DVT in the right leg. 4. Bronchitis. 5. Vertigo.  FAMILY MEDICAL HISTORY:  Coronary artery disease.  SOCIAL HISTORY:  Patient of Dr. Yetta Barre.  Occasional alcohol use. Occasional smoking.  DRUG ALLERGIES:  Erythromycin and sulfa.  CURRENT MEDICATIONS: 1. Aspirin 81 mg daily. 2. Zoloft 100 mg daily. 3. Klonopin 0.05 mg 2 tablets b.i.d. 4. Wellbutrin 300 mg daily. 5. Synthroid 150 mcg daily. 6. Toprol-XL 50 mg daily. 7. She is also taking pravastatin 40 mg daily. 8. Tribenzor 40/5/25.  REVIEW OF SYSTEMS:  Pain on range of motion and pain with ambulation.  PHYSICAL EXAMINATION:  VITAL SIGNS:  Pulse 82, respirations 16, blood pressure 138/72. GENERAL:  The patient is a healthy-appearing 60 year old female, in no acute distress.  Pleasant mood and affect.  Alert and oriented x3. HEENT:  Head and neck exam shows cranial nerves II through XII grossly intact.  She has full range of motion of cervical spine. CHEST:  Active breath sounds bilaterally.  No wheezes, rhonchi, or rales. HEART:  Regular rate and rhythm.  No murmur. ABDOMEN:  Nontender, nondistended, with active bowel sounds. EXTREMITIES:  Showed moderate tenderness in bilateral knees, right greater than left, with crepitus. NEUROVASCULAR:  She  is intact.  She has no rashes or edema.  X-RAYS:  Show end-stage osteoarthritis of the right knee.  IMPRESSION:  End-stage osteoarthritis, right knee.  PLAN OF ACTION:  Right total knee arthroplasty by Dr. Ranee Gosselin.     Everton Bertha B. Carmisha Larusso, P.A.   ______________________________ Georges Lynch Darrelyn Hillock, M.D.    TBD/MEDQ  D:  06/28/2011  T:  06/28/2011  Job:  [id number]

## 2011-06-29 ENCOUNTER — Other Ambulatory Visit: Payer: Self-pay | Admitting: Orthopedic Surgery

## 2011-06-29 DIAGNOSIS — M1711 Unilateral primary osteoarthritis, right knee: Secondary | ICD-10-CM

## 2011-07-06 ENCOUNTER — Ambulatory Visit: Payer: BC Managed Care – PPO | Admitting: Internal Medicine

## 2011-07-10 ENCOUNTER — Encounter (HOSPITAL_COMMUNITY): Payer: Self-pay | Admitting: Pharmacy Technician

## 2011-07-11 ENCOUNTER — Encounter (HOSPITAL_COMMUNITY): Payer: Self-pay

## 2011-07-11 ENCOUNTER — Other Ambulatory Visit: Payer: Self-pay

## 2011-07-11 ENCOUNTER — Encounter (HOSPITAL_COMMUNITY)
Admission: RE | Admit: 2011-07-11 | Discharge: 2011-07-11 | Disposition: A | Discharge status: Home / Self Care | Payer: BC Managed Care – PPO | Source: Ambulatory Visit | Attending: Orthopedic Surgery | Admitting: Orthopedic Surgery

## 2011-07-11 ENCOUNTER — Ambulatory Visit (HOSPITAL_COMMUNITY)
Admission: RE | Admit: 2011-07-11 | Discharge: 2011-07-11 | Disposition: A | Discharge status: Home / Self Care | Payer: BC Managed Care – PPO | Source: Ambulatory Visit | Attending: Orthopedic Surgery | Admitting: Orthopedic Surgery

## 2011-07-11 DIAGNOSIS — M479 Spondylosis, unspecified: Secondary | ICD-10-CM | POA: Insufficient documentation

## 2011-07-11 DIAGNOSIS — Z01810 Encounter for preprocedural cardiovascular examination: Secondary | ICD-10-CM | POA: Insufficient documentation

## 2011-07-11 DIAGNOSIS — IMO0002 Reserved for concepts with insufficient information to code with codable children: Secondary | ICD-10-CM | POA: Insufficient documentation

## 2011-07-11 DIAGNOSIS — Z01812 Encounter for preprocedural laboratory examination: Secondary | ICD-10-CM | POA: Insufficient documentation

## 2011-07-11 DIAGNOSIS — M899 Disorder of bone, unspecified: Secondary | ICD-10-CM | POA: Insufficient documentation

## 2011-07-11 DIAGNOSIS — M1710 Unilateral primary osteoarthritis, unspecified knee: Secondary | ICD-10-CM | POA: Insufficient documentation

## 2011-07-11 HISTORY — DX: Gastro-esophageal reflux disease without esophagitis: K21.9

## 2011-07-11 HISTORY — DX: Other specified postprocedural states: Z98.890

## 2011-07-11 HISTORY — DX: Anxiety disorder, unspecified: F41.9

## 2011-07-11 HISTORY — DX: Depression, unspecified: F32.A

## 2011-07-11 HISTORY — DX: Major depressive disorder, single episode, unspecified: F32.9

## 2011-07-11 HISTORY — DX: Nausea with vomiting, unspecified: R11.2

## 2011-07-11 LAB — APTT: aPTT: 35 seconds (ref 24–37)

## 2011-07-11 LAB — URINALYSIS, ROUTINE W REFLEX MICROSCOPIC
Bilirubin Urine: NEGATIVE
Glucose, UA: NEGATIVE mg/dL
Hgb urine dipstick: NEGATIVE
Ketones, ur: NEGATIVE mg/dL
Nitrite: NEGATIVE
Protein, ur: NEGATIVE mg/dL
Specific Gravity, Urine: 1.015 (ref 1.005–1.030)
Urobilinogen, UA: 0.2 mg/dL (ref 0.0–1.0)
pH: 5.5 (ref 5.0–8.0)

## 2011-07-11 LAB — DIFFERENTIAL
Lymphs Abs: 3.4 10*3/uL (ref 0.7–4.0)
Monocytes Relative: 11 % (ref 3–12)
Neutro Abs: 4.2 10*3/uL (ref 1.7–7.7)
Neutrophils Relative %: 47 % (ref 43–77)

## 2011-07-11 LAB — COMPREHENSIVE METABOLIC PANEL
AST: 26 U/L (ref 0–37)
Albumin: 4.3 g/dL (ref 3.5–5.2)
Alkaline Phosphatase: 74 U/L (ref 39–117)
BUN: 32 mg/dL — ABNORMAL HIGH (ref 6–23)
Creatinine, Ser: 2 mg/dL — ABNORMAL HIGH (ref 0.50–1.10)
Potassium: 3.6 mEq/L (ref 3.5–5.1)
Total Protein: 8.2 g/dL (ref 6.0–8.3)

## 2011-07-11 LAB — PROTIME-INR
INR: 1.02 (ref 0.00–1.49)
Prothrombin Time: 13.6 seconds (ref 11.6–15.2)

## 2011-07-11 LAB — SURGICAL PCR SCREEN: MRSA, PCR: NEGATIVE

## 2011-07-11 LAB — URINE MICROSCOPIC-ADD ON

## 2011-07-11 LAB — CBC
HCT: 40.9 % (ref 36.0–46.0)
MCHC: 34.5 g/dL (ref 30.0–36.0)
Platelets: 208 10*3/uL (ref 150–400)
RDW: 15.9 % — ABNORMAL HIGH (ref 11.5–15.5)

## 2011-07-11 NOTE — Patient Instructions (Addendum)
20 CHAISE PASSARELLA  07/11/2011   Your procedure is scheduled on: 07-18-11  Report to [facility] at 1030 AM.  Call this number if you have problems the morning of surgery: [phone]   Remember:   Do not eat food:After Midnight.  Do not drink clear liquids: After Midnight.  Take these medicines the morning of surgery with A SIP OF WATER: pantaprazole, symbicort inhaler if needed and bring inhaler, synthroid, metoprolol   Do not wear jewelry, make-up or nail polish.  Do not wear lotions, powders, or perfumes., no deodorant.  Do not shave 48 hours prior to surgery.  Do not bring valuables to the hospital.  Contacts, dentures or bridgework may not be worn into surgery.  Leave suitcase in the car. After surgery it may be brought to your room.  For patients admitted to the hospital, checkout time is 11:00 AM the day of discharge.   Patients discharged the day of surgery will not be allowed to drive home.  Name and phone number of your driver:   Special Instructions: CHG Shower Use Special Wash: 1/2 bottle night before surgery and 1/2 bottle morning of surgery., neck down avoid private area, no shaving 2 days before showers   Please read over the following fact sheets that you were given: MRSA Information, incentive spirometer fact sheet

## 2011-07-18 ENCOUNTER — Inpatient Hospital Stay (HOSPITAL_COMMUNITY): Payer: BC Managed Care – PPO | Admitting: Anesthesiology

## 2011-07-18 ENCOUNTER — Encounter (HOSPITAL_COMMUNITY): Payer: Self-pay | Admitting: Anesthesiology

## 2011-07-18 ENCOUNTER — Encounter (HOSPITAL_COMMUNITY): Payer: Self-pay | Admitting: *Deleted

## 2011-07-18 ENCOUNTER — Inpatient Hospital Stay (HOSPITAL_COMMUNITY): Payer: BC Managed Care – PPO

## 2011-07-18 ENCOUNTER — Inpatient Hospital Stay (HOSPITAL_COMMUNITY)
Admission: RE | Admit: 2011-07-18 | Discharge: 2011-07-22 | DRG: 209 | Disposition: A | Discharge status: Home Health | Payer: BC Managed Care – PPO | Source: Ambulatory Visit | Attending: Orthopedic Surgery | Admitting: Orthopedic Surgery

## 2011-07-18 ENCOUNTER — Encounter (HOSPITAL_COMMUNITY)
Admission: RE | Disposition: A | Discharge status: Home Health | Payer: Self-pay | Source: Ambulatory Visit | Attending: Orthopedic Surgery

## 2011-07-18 DIAGNOSIS — I10 Essential (primary) hypertension: Secondary | ICD-10-CM | POA: Diagnosis present

## 2011-07-18 DIAGNOSIS — M21869 Other specified acquired deformities of unspecified lower leg: Secondary | ICD-10-CM | POA: Diagnosis present

## 2011-07-18 DIAGNOSIS — M1710 Unilateral primary osteoarthritis, unspecified knee: Principal | ICD-10-CM | POA: Diagnosis present

## 2011-07-18 DIAGNOSIS — E039 Hypothyroidism, unspecified: Secondary | ICD-10-CM | POA: Diagnosis present

## 2011-07-18 DIAGNOSIS — J449 Chronic obstructive pulmonary disease, unspecified: Secondary | ICD-10-CM | POA: Diagnosis present

## 2011-07-18 DIAGNOSIS — J4489 Other specified chronic obstructive pulmonary disease: Secondary | ICD-10-CM | POA: Diagnosis present

## 2011-07-18 DIAGNOSIS — F419 Anxiety disorder, unspecified: Secondary | ICD-10-CM

## 2011-07-18 DIAGNOSIS — K219 Gastro-esophageal reflux disease without esophagitis: Secondary | ICD-10-CM | POA: Diagnosis present

## 2011-07-18 HISTORY — PX: TOTAL KNEE ARTHROPLASTY: SHX125

## 2011-07-18 LAB — TYPE AND SCREEN: ABO/RH(D): A NEG

## 2011-07-18 SURGERY — ARTHROPLASTY, KNEE, TOTAL
Anesthesia: General | Site: Knee | Laterality: Right | Wound class: Clean

## 2011-07-18 MED ORDER — METHOCARBAMOL 500 MG PO TABS
500.0000 mg | ORAL_TABLET | Freq: Four times a day (QID) | ORAL | Status: DC | PRN
  Administered 2011-07-18 – 2011-07-19 (×2): 500 mg via ORAL
  Filled 2011-07-18 (×2): qty 1

## 2011-07-18 MED ORDER — EPHEDRINE SULFATE 50 MG/ML IJ SOLN
INTRAMUSCULAR | Status: DC | PRN
  Administered 2011-07-18: 10 mg via INTRAVENOUS

## 2011-07-18 MED ORDER — LEVOTHYROXINE SODIUM 150 MCG PO TABS
150.0000 ug | ORAL_TABLET | Freq: Every day | ORAL | Status: DC
  Administered 2011-07-19 – 2011-07-22 (×4): 150 ug via ORAL
  Filled 2011-07-18 (×4): qty 1

## 2011-07-18 MED ORDER — ROCURONIUM BROMIDE 100 MG/10ML IV SOLN
INTRAVENOUS | Status: DC | PRN
  Administered 2011-07-18: 40 mg via INTRAVENOUS
  Administered 2011-07-18: 10 mg via INTRAVENOUS

## 2011-07-18 MED ORDER — CEFAZOLIN SODIUM-DEXTROSE 2-3 GM-% IV SOLR
2.0000 g | Freq: Once | INTRAVENOUS | Status: AC
  Administered 2011-07-18: 2 g via INTRAVENOUS

## 2011-07-18 MED ORDER — SUCCINYLCHOLINE CHLORIDE 20 MG/ML IJ SOLN
INTRAMUSCULAR | Status: DC | PRN
  Administered 2011-07-18: 100 mg via INTRAVENOUS

## 2011-07-18 MED ORDER — MECLIZINE HCL 12.5 MG PO TABS
12.5000 mg | ORAL_TABLET | Freq: Four times a day (QID) | ORAL | Status: DC | PRN
  Filled 2011-07-18: qty 1

## 2011-07-18 MED ORDER — PROPOFOL 10 MG/ML IV EMUL
INTRAVENOUS | Status: DC | PRN
  Administered 2011-07-18: 180 mg via INTRAVENOUS

## 2011-07-18 MED ORDER — PATIENT'S GUIDE TO USING COUMADIN BOOK
Freq: Once | Status: DC
  Filled 2011-07-18 (×2): qty 1

## 2011-07-18 MED ORDER — HYDROMORPHONE HCL PF 2 MG/ML IJ SOLN
0.5000 mg | INTRAMUSCULAR | Status: DC | PRN
  Administered 2011-07-18: 0.5 mg via INTRAVENOUS
  Administered 2011-07-19 (×3): 1 mg via INTRAVENOUS
  Filled 2011-07-18 (×4): qty 1

## 2011-07-18 MED ORDER — CEFAZOLIN SODIUM 1-5 GM-% IV SOLN
1.0000 g | Freq: Four times a day (QID) | INTRAVENOUS | Status: AC
  Administered 2011-07-18 – 2011-07-19 (×3): 1 g via INTRAVENOUS
  Filled 2011-07-18 (×3): qty 50

## 2011-07-18 MED ORDER — BUPIVACAINE LIPOSOME 1.3 % IJ SUSP
20.0000 mL | Freq: Once | INTRAMUSCULAR | Status: AC
  Administered 2011-07-18: 20 mL
  Filled 2011-07-18: qty 20

## 2011-07-18 MED ORDER — BUDESONIDE-FORMOTEROL FUMARATE 160-4.5 MCG/ACT IN AERO
2.0000 | INHALATION_SPRAY | Freq: Two times a day (BID) | RESPIRATORY_TRACT | Status: DC | PRN
  Filled 2011-07-18: qty 6

## 2011-07-18 MED ORDER — ALLOPURINOL 300 MG PO TABS
300.0000 mg | ORAL_TABLET | Freq: Every day | ORAL | Status: DC | PRN
Start: 2011-07-18 — End: 2011-07-22
  Filled 2011-07-18: qty 1

## 2011-07-18 MED ORDER — BISACODYL 10 MG RE SUPP: 10.0000 mg | Freq: Every day | RECTAL | Status: DC | PRN

## 2011-07-18 MED ORDER — ONDANSETRON HCL 4 MG/2ML IJ SOLN
INTRAMUSCULAR | Status: DC | PRN
  Administered 2011-07-18: 4 mg via INTRAVENOUS

## 2011-07-18 MED ORDER — SERTRALINE HCL 100 MG PO TABS
100.0000 mg | ORAL_TABLET | Freq: Every day | ORAL | Status: DC
  Administered 2011-07-18 – 2011-07-22 (×5): 100 mg via ORAL
  Filled 2011-07-18 (×5): qty 1

## 2011-07-18 MED ORDER — MIDAZOLAM HCL 5 MG/5ML IJ SOLN
INTRAMUSCULAR | Status: DC | PRN
  Administered 2011-07-18: 2 mg via INTRAVENOUS

## 2011-07-18 MED ORDER — ALPRAZOLAM 0.5 MG PO TABS: 0.5000 mg | ORAL_TABLET | Freq: Three times a day (TID) | ORAL | Status: DC | PRN

## 2011-07-18 MED ORDER — HYDROMORPHONE HCL PF 1 MG/ML IJ SOLN
0.2500 mg | INTRAMUSCULAR | Status: DC | PRN
  Administered 2011-07-18 (×3): 0.5 mg via INTRAVENOUS

## 2011-07-18 MED ORDER — ACETAMINOPHEN 650 MG RE SUPP: 650.0000 mg | Freq: Four times a day (QID) | RECTAL | Status: DC | PRN

## 2011-07-18 MED ORDER — BUPROPION HCL ER (XL) 300 MG PO TB24
300.0000 mg | ORAL_TABLET | Freq: Every day | ORAL | Status: DC
  Administered 2011-07-18 – 2011-07-22 (×5): 300 mg via ORAL
  Filled 2011-07-18 (×5): qty 1

## 2011-07-18 MED ORDER — HEPARIN SODIUM (PORCINE) 5000 UNIT/ML IJ SOLN
5000.0000 [IU] | Freq: Two times a day (BID) | INTRAMUSCULAR | Status: DC
  Administered 2011-07-18 – 2011-07-21 (×6): 5000 [IU] via SUBCUTANEOUS
  Filled 2011-07-18 (×7): qty 1

## 2011-07-18 MED ORDER — ONDANSETRON HCL 4 MG PO TABS
4.0000 mg | ORAL_TABLET | Freq: Four times a day (QID) | ORAL | Status: DC | PRN
  Administered 2011-07-21 – 2011-07-22 (×2): 4 mg via ORAL
  Filled 2011-07-18 (×2): qty 1

## 2011-07-18 MED ORDER — DOCUSATE SODIUM 100 MG PO CAPS
100.0000 mg | ORAL_CAPSULE | Freq: Two times a day (BID) | ORAL | Status: DC
  Administered 2011-07-18 – 2011-07-22 (×8): 100 mg via ORAL
  Filled 2011-07-18 (×9): qty 1

## 2011-07-18 MED ORDER — FERROUS SULFATE 325 (65 FE) MG PO TABS
325.0000 mg | ORAL_TABLET | Freq: Three times a day (TID) | ORAL | Status: DC
  Administered 2011-07-18 – 2011-07-22 (×11): 325 mg via ORAL
  Filled 2011-07-18 (×13): qty 1

## 2011-07-18 MED ORDER — WARFARIN VIDEO: Freq: Once | Status: DC

## 2011-07-18 MED ORDER — ONDANSETRON HCL 4 MG/2ML IJ SOLN
4.0000 mg | Freq: Four times a day (QID) | INTRAMUSCULAR | Status: DC | PRN
  Administered 2011-07-19 – 2011-07-20 (×3): 4 mg via INTRAVENOUS
  Filled 2011-07-18 (×3): qty 2

## 2011-07-18 MED ORDER — SODIUM CHLORIDE 0.9 % IR SOLN
Status: DC | PRN
  Administered 2011-07-18: 3000 mL

## 2011-07-18 MED ORDER — ALUM & MAG HYDROXIDE-SIMETH 200-200-20 MG/5ML PO SUSP: 30.0000 mL | ORAL | Status: DC | PRN

## 2011-07-18 MED ORDER — LACTATED RINGERS IV SOLN: INTRAVENOUS | Status: DC

## 2011-07-18 MED ORDER — SODIUM CHLORIDE 0.9 % IJ SOLN
INTRAMUSCULAR | Status: DC | PRN
  Administered 2011-07-18: 30 mL via INTRAVENOUS

## 2011-07-18 MED ORDER — PROMETHAZINE HCL 25 MG/ML IJ SOLN
6.2500 mg | INTRAMUSCULAR | Status: DC | PRN
  Administered 2011-07-18: 6.25 mg via INTRAVENOUS

## 2011-07-18 MED ORDER — METOPROLOL SUCCINATE ER 50 MG PO TB24
50.0000 mg | ORAL_TABLET | Freq: Three times a day (TID) | ORAL | Status: DC
  Administered 2011-07-18 – 2011-07-19 (×3): 50 mg via ORAL
  Filled 2011-07-18 (×7): qty 1

## 2011-07-18 MED ORDER — GLYCOPYRROLATE 0.2 MG/ML IJ SOLN
INTRAMUSCULAR | Status: DC | PRN
  Administered 2011-07-18: .5 mg via INTRAVENOUS

## 2011-07-18 MED ORDER — CLONAZEPAM 0.5 MG PO TABS
0.5000 mg | ORAL_TABLET | Freq: Two times a day (BID) | ORAL | Status: DC | PRN
Start: 2011-07-18 — End: 2011-07-22

## 2011-07-18 MED ORDER — SODIUM CHLORIDE 0.9 % IR SOLN
Status: DC | PRN
  Administered 2011-07-18: 13:00:00

## 2011-07-18 MED ORDER — THROMBIN 5000 UNITS EX KIT
PACK | CUTANEOUS | Status: DC | PRN
  Administered 2011-07-18: 10000 [IU] via TOPICAL

## 2011-07-18 MED ORDER — ACETAMINOPHEN 325 MG PO TABS: 650.0000 mg | ORAL_TABLET | Freq: Four times a day (QID) | ORAL | Status: DC | PRN

## 2011-07-18 MED ORDER — LIDOCAINE HCL (CARDIAC) 20 MG/ML IV SOLN
INTRAVENOUS | Status: DC | PRN
  Administered 2011-07-18: 50 mg via INTRAVENOUS

## 2011-07-18 MED ORDER — LACTATED RINGERS IV SOLN
INTRAVENOUS | Status: DC
  Administered 2011-07-18: 15:00:00 via INTRAVENOUS
  Administered 2011-07-18: 1000 mL via INTRAVENOUS
  Administered 2011-07-18: 14:00:00 via INTRAVENOUS

## 2011-07-18 MED ORDER — DEXAMETHASONE SODIUM PHOSPHATE 10 MG/ML IJ SOLN
INTRAMUSCULAR | Status: DC | PRN
  Administered 2011-07-18: 10 mg via INTRAVENOUS

## 2011-07-18 MED ORDER — DEXTROSE 5 % IV SOLN
500.0000 mg | Freq: Four times a day (QID) | INTRAVENOUS | Status: DC | PRN
  Administered 2011-07-18: 500 mg via INTRAVENOUS
  Filled 2011-07-18: qty 5

## 2011-07-18 MED ORDER — HYDROCODONE-ACETAMINOPHEN 10-325 MG PO TABS
1.0000 | ORAL_TABLET | ORAL | Status: DC | PRN
  Administered 2011-07-18: 1 via ORAL
  Administered 2011-07-19 (×3): 2 via ORAL
  Administered 2011-07-19: 1 via ORAL
  Administered 2011-07-19: 2 via ORAL
  Administered 2011-07-19 (×2): 1 via ORAL
  Administered 2011-07-20 – 2011-07-21 (×6): 2 via ORAL
  Filled 2011-07-18 (×2): qty 2
  Filled 2011-07-18: qty 1
  Filled 2011-07-18 (×7): qty 2
  Filled 2011-07-18: qty 1
  Filled 2011-07-18 (×2): qty 2

## 2011-07-18 MED ORDER — BISACODYL 5 MG PO TBEC: 10.0000 mg | DELAYED_RELEASE_TABLET | Freq: Every day | ORAL | Status: DC | PRN

## 2011-07-18 MED ORDER — HEMOSTATIC AGENTS (NO CHARGE) OPTIME
TOPICAL | Status: DC | PRN
  Administered 2011-07-18: 1

## 2011-07-18 MED ORDER — ACETAMINOPHEN 10 MG/ML IV SOLN
INTRAVENOUS | Status: DC | PRN
  Administered 2011-07-18: 1000 mg via INTRAVENOUS

## 2011-07-18 MED ORDER — MENTHOL 3 MG MT LOZG: 1.0000 | LOZENGE | OROMUCOSAL | Status: DC | PRN

## 2011-07-18 MED ORDER — STERILE WATER FOR IRRIGATION IR SOLN
Status: DC | PRN
  Administered 2011-07-18: 1500 mL

## 2011-07-18 MED ORDER — PANTOPRAZOLE SODIUM 40 MG PO TBEC
40.0000 mg | DELAYED_RELEASE_TABLET | Freq: Every day | ORAL | Status: DC
  Administered 2011-07-18 – 2011-07-22 (×5): 40 mg via ORAL
  Filled 2011-07-18 (×5): qty 1

## 2011-07-18 MED ORDER — LACTATED RINGERS IV SOLN
INTRAVENOUS | Status: DC
  Administered 2011-07-18 – 2011-07-20 (×5): via INTRAVENOUS

## 2011-07-18 MED ORDER — WARFARIN SODIUM 7.5 MG PO TABS
7.5000 mg | ORAL_TABLET | Freq: Once | ORAL | Status: AC
  Administered 2011-07-18: 7.5 mg via ORAL
  Filled 2011-07-18: qty 1

## 2011-07-18 MED ORDER — NEOSTIGMINE METHYLSULFATE 1 MG/ML IJ SOLN
INTRAMUSCULAR | Status: DC | PRN
  Administered 2011-07-18: 4 mg via INTRAVENOUS

## 2011-07-18 MED ORDER — PHENOL 1.4 % MT LIQD: 1.0000 | OROMUCOSAL | Status: DC | PRN

## 2011-07-18 MED ORDER — FENTANYL CITRATE 0.05 MG/ML IJ SOLN
INTRAMUSCULAR | Status: DC | PRN
  Administered 2011-07-18 (×2): 50 ug via INTRAVENOUS
  Administered 2011-07-18: 100 ug via INTRAVENOUS
  Administered 2011-07-18: 50 ug via INTRAVENOUS

## 2011-07-18 MED ORDER — HYDROMORPHONE HCL PF 1 MG/ML IJ SOLN
INTRAMUSCULAR | Status: DC | PRN
  Administered 2011-07-18 (×4): 0.5 mg via INTRAVENOUS

## 2011-07-18 MED ORDER — POVIDONE-IODINE 7.5 % EX SOLN
Freq: Once | CUTANEOUS | Status: DC
  Filled 2011-07-18: qty 118

## 2011-07-18 MED ORDER — BACITRACIN ZINC 500 UNIT/GM EX OINT
TOPICAL_OINTMENT | CUTANEOUS | Status: DC | PRN
  Administered 2011-07-18: 1 via TOPICAL

## 2011-07-18 SURGICAL SUPPLY — 60 items
BAG SPEC THK2 15X12 ZIP CLS (MISCELLANEOUS) ×1
BAG ZIPLOCK 12X15 (MISCELLANEOUS) ×2 IMPLANT
BANDAGE ELASTIC 4 VELCRO ST LF (GAUZE/BANDAGES/DRESSINGS) ×2 IMPLANT
BANDAGE ELASTIC 6 VELCRO ST LF (GAUZE/BANDAGES/DRESSINGS) ×2 IMPLANT
BANDAGE ESMARK 6X9 LF (GAUZE/BANDAGES/DRESSINGS) ×1 IMPLANT
BANDAGE GAUZE ELAST BULKY 4 IN (GAUZE/BANDAGES/DRESSINGS) ×2 IMPLANT
BLADE SAG 18X100X1.27 (BLADE) ×2 IMPLANT
BLADE SAW SGTL 13.0X1.19X90.0M (BLADE) ×2 IMPLANT
BNDG CMPR 9X6 STRL LF SNTH (GAUZE/BANDAGES/DRESSINGS) ×1
BNDG ESMARK 6X9 LF (GAUZE/BANDAGES/DRESSINGS) ×2
BONE CEMENT GENTAMICIN (Cement) ×4 IMPLANT
CEMENT BONE GENTAMICIN 40 (Cement) ×2 IMPLANT
CLOTH BEACON ORANGE TIMEOUT ST (SAFETY) ×2 IMPLANT
CUFF TOURN SGL QUICK 34 (TOURNIQUET CUFF)
CUFF TOURN SGL QUICK 44 (TOURNIQUET CUFF) ×1 IMPLANT
CUFF TRNQT CYL 34X4X40X1 (TOURNIQUET CUFF) ×1 IMPLANT
DRAPE EXTREMITY T 121X128X90 (DRAPE) ×2 IMPLANT
DRAPE INCISE IOBAN 66X45 STRL (DRAPES) ×2 IMPLANT
DRAPE POUCH INSTRU U-SHP 10X18 (DRAPES) ×2 IMPLANT
DRAPE U-SHAPE 47X51 STRL (DRAPES) ×2 IMPLANT
DRSG ADAPTIC 3X8 NADH LF (GAUZE/BANDAGES/DRESSINGS) ×2 IMPLANT
DRSG PAD ABDOMINAL 8X10 ST (GAUZE/BANDAGES/DRESSINGS) ×5 IMPLANT
DURAPREP 26ML APPLICATOR (WOUND CARE) ×2 IMPLANT
ELECT REM PT RETURN 9FT ADLT (ELECTROSURGICAL) ×2
ELECTRODE REM PT RTRN 9FT ADLT (ELECTROSURGICAL) ×1 IMPLANT
EVACUATOR 1/8 PVC DRAIN (DRAIN) ×2 IMPLANT
FACESHIELD LNG OPTICON STERILE (SAFETY) ×12 IMPLANT
GLOVE BIOGEL PI IND STRL 8.5 (GLOVE) ×1 IMPLANT
GLOVE BIOGEL PI INDICATOR 8.5 (GLOVE) ×1
GLOVE ECLIPSE 8.0 STRL XLNG CF (GLOVE) ×2 IMPLANT
GOWN PREVENTION PLUS LG XLONG (DISPOSABLE) ×4 IMPLANT
GOWN STRL REIN XL XLG (GOWN DISPOSABLE) ×4 IMPLANT
HANDPIECE INTERPULSE COAX TIP (DISPOSABLE) ×2
IMMOBILIZER KNEE 20 (SOFTGOODS) ×2
IMMOBILIZER KNEE 20 THIGH 36 (SOFTGOODS) IMPLANT
KIT BASIN OR (CUSTOM PROCEDURE TRAY) ×2 IMPLANT
MANIFOLD NEPTUNE II (INSTRUMENTS) ×2 IMPLANT
NS IRRIG 1000ML POUR BTL (IV SOLUTION) ×2 IMPLANT
PACK TOTAL JOINT (CUSTOM PROCEDURE TRAY) ×2 IMPLANT
PAD ABD 7.5X8 STRL (GAUZE/BANDAGES/DRESSINGS) ×2 IMPLANT
POSITIONER SURGICAL ARM (MISCELLANEOUS) ×2 IMPLANT
SET HNDPC FAN SPRY TIP SCT (DISPOSABLE) ×1 IMPLANT
SET PAD KNEE POSITIONER (MISCELLANEOUS) ×2 IMPLANT
SPONGE GAUZE 4X4 12PLY (GAUZE/BANDAGES/DRESSINGS) ×1 IMPLANT
SPONGE LAP 18X18 X RAY DECT (DISPOSABLE) ×2 IMPLANT
SPONGE SURGIFOAM ABS GEL 100 (HEMOSTASIS) ×2 IMPLANT
STAPLER VISISTAT 35W (STAPLE) IMPLANT
SUCTION FRAZIER 12FR DISP (SUCTIONS) ×2 IMPLANT
SUT BONE WAX W31G (SUTURE) ×2 IMPLANT
SUT VIC AB 0 CT1 27 (SUTURE) ×4
SUT VIC AB 0 CT1 27XBRD ANTBC (SUTURE) ×2 IMPLANT
SUT VIC AB 1 CT1 27 (SUTURE) ×10
SUT VIC AB 1 CT1 27XBRD ANTBC (SUTURE) ×5 IMPLANT
SUT VIC AB 2-0 CT1 27 (SUTURE) ×6
SUT VIC AB 2-0 CT1 TAPERPNT 27 (SUTURE) ×3 IMPLANT
TOWEL OR 17X26 10 PK STRL BLUE (TOWEL DISPOSABLE) ×4 IMPLANT
TOWER CARTRIDGE SMART MIX (DISPOSABLE) ×2 IMPLANT
TRAY FOLEY CATH 14FRSI W/METER (CATHETERS) ×2 IMPLANT
WATER STERILE IRR 1500ML POUR (IV SOLUTION) ×2 IMPLANT
WRAP KNEE MAXI GEL POST OP (GAUZE/BANDAGES/DRESSINGS) ×4 IMPLANT

## 2011-07-18 NOTE — Anesthesia Postprocedure Evaluation (Addendum)
  Anesthesia Post-op Note  Patient: Sonia Dawson  Procedure(s) Performed:  TOTAL KNEE ARTHROPLASTY  Patient Location: PACU  Anesthesia Type: General  Level of Consciousness: awake and alert   Airway and Oxygen Therapy: Patient Spontanous Breathing with oxygen  Post-op Pain: mild  Post-op Assessment: Post-op Vital signs reviewed, Patient's Cardiovascular Status Stable, Respiratory Function Stable, Patent Airway and No signs of Nausea or vomiting  Post-op Vital Signs: stable  Complications: No apparent anesthesia complications

## 2011-07-18 NOTE — Brief Op Note (Signed)
07/18/2011  3:15 PM  PATIENT:  Sonia Dawson  60 y.o. female  PRE-OPERATIVE DIAGNOSIS:  Osteoarthritis of the Right Knee  POST-OPERATIVE DIAGNOSIS:  Osteoarthritis of the Right Knee  PROCEDURE:  Procedure(s): TOTAL KNEE ARTHROPLASTY  SURGEON:  Surgeon(s): Windy Fast A Jayme Cham James P [place] Assistant  :    ANESTHESIA:   general  EBL:  Total I/O In: 2000 [I.V.:2000] Out: 100 [Urine:100]  BLOOD ADMINISTERED:none  DRAINS: (One) Hemovact drain(s) in the Right Knee with  Suction Open   LOCAL MEDICATIONS USED:  MARCAINE 20CC Exaprel with 30cc Saline  SPECIMEN:  No Specimen  DISPOSITION OF SPECIMEN:  N/A  COUNTS:  YES  TOURNIQUET:   Total Tourniquet Time Documented: Thigh (Right) - 94 minutes  DICTATION: .Other Dictation: Dictation Number [id number]  PLAN OF CARE: Admit to inpatient   PATIENT DISPOSITION:  PACU - hemodynamically stable.   Delay start of Pharmacological VTE agent (>24hrs) due to surgical blood loss or risk of bleeding:  {YES/NO/NOT APPLICABLE:20182

## 2011-07-18 NOTE — Anesthesia Procedure Notes (Signed)
Procedures

## 2011-07-18 NOTE — Anesthesia Preprocedure Evaluation (Addendum)
Anesthesia Evaluation  Patient identified by MRN, date of birth, ID band Patient awake    Reviewed: Allergy & Precautions, H&P , NPO status , Patient's Chart, lab work & pertinent test results, reviewed documented beta blocker date and time   History of Anesthesia Complications (+) PONV  Airway Mallampati: II TM Distance: >3 FB Neck ROM: full    Dental No notable dental hx. (+) Teeth Intact and Dental Advisory Given   Pulmonary neg pulmonary ROS, COPD COPD inhaler,  Mild COPD clear to auscultation  Pulmonary exam normal       Cardiovascular Exercise Tolerance: Good hypertension, On Home Beta Blockers neg cardio ROS regular Normal    Neuro/Psych PSYCHIATRIC DISORDERS Negative Neurological ROS  Negative Psych ROS   GI/Hepatic negative GI ROS, Neg liver ROS, GERD-  Medicated,  Endo/Other  Negative Endocrine ROSHypothyroidism Morbid obesity  Renal/GU negative Renal ROS  Genitourinary negative   Musculoskeletal   Abdominal   Peds  Hematology negative hematology ROS (+)   Anesthesia Other Findings   Reproductive/Obstetrics negative OB ROS                          Anesthesia Physical Anesthesia Plan  ASA: II  Anesthesia Plan: General   Post-op Pain Management:    Induction:   Airway Management Planned: Oral ETT  Additional Equipment:   Intra-op Plan:   Post-operative Plan: Extubation in OR  Informed Consent: I have reviewed the patients History and Physical, chart, labs and discussed the procedure including the risks, benefits and alternatives for the proposed anesthesia with the patient or authorized representative who has indicated his/her understanding and acceptance.   Dental Advisory Given  Plan Discussed with: CRNA and Surgeon  Anesthesia Plan Comments:        Anesthesia Quick Evaluation

## 2011-07-18 NOTE — Progress Notes (Signed)
ANTICOAGULATION CONSULT NOTE - Initial Consult  Pharmacy Consult for Coumadin Indication: VTE prophylaxis  Allergies  Allergen Reactions  . Erythromycin Nausea And Vomiting  . Sulfonamide Derivatives Nausea Only   Patient Measurements: Height: 5\' 6"  (167.6 cm) Weight: 266 lb (120.657 kg) IBW/kg (Calculated) : 59.3   Vital Signs: Temp: 97.6 F (36.4 C) (11/28 1741) Temp src: Oral (11/28 1019) BP: 146/85 mmHg (11/28 1741) Pulse Rate: 69  (11/28 1741)  Labs: No results found for this basename: HGB:2,HCT:3,PLT:3,APTT:3,LABPROT:3,INR:3,HEPARINUNFRC:3,CREATININE:3,CKTOTAL:3,CKMB:3,TROPONINI:3 in the last 72 hours Estimated Creatinine Clearance: 40.1 ml/min (by C-G formula based on Cr of 2).  Medical History: Past Medical History  Diagnosis Date  . Hypertension   . History of DVT (deep vein thrombosis) 2007    right leg  . Hypothyroidism   . Fatty liver   . Arthritis   . COPD (chronic obstructive pulmonary disease)     pt denies copd  . Asthma     pt denies asthma, says hx of bronchitis  . GERD (gastroesophageal reflux disease)   . Anxiety   . Depression   . PONV (postoperative nausea and vomiting)    Medications:  Prescriptions prior to admission  Medication Sig Dispense Refill  . ALPRAZolam (XANAX) 0.5 MG tablet Take 1 tablet (0.5 mg total) by mouth 3 (three) times daily as needed for sleep or anxiety.  75 tablet  3  . buPROPion (WELLBUTRIN XL) 300 MG 24 hr tablet       . clonazePAM (KLONOPIN) 0.5 MG tablet Take 1 tablet (0.5 mg total) by mouth 2 (two) times daily as needed for anxiety. 2 bid  60 tablet  5  . levothyroxine (SYNTHROID) 150 MCG tablet       . metoprolol (TOPROL-XL) 50 MG 24 hr tablet 50 mg 3 (three) times daily.       . pantoprazole (PROTONIX) 40 MG tablet TAKE ONE TABLET BY MOUTH ONE TIME DAILY  90 tablet  1  . sertraline (ZOLOFT) 100 MG tablet       . DISCONTD: aspirin (BAYER ASPIRIN EC LOW DOSE) 81 MG EC tablet Take 81 mg by mouth every morning.        Marland Kitchen allopurinol (ZYLOPRIM) 300 MG tablet Take 300 mg by mouth daily as needed. GOUT       . budesonide-formoterol (SYMBICORT) 160-4.5 MCG/ACT inhaler Inhale 2 puffs into the lungs 2 (two) times daily as needed. WHEEZING       . meclizine (ANTIVERT) 12.5 MG tablet Take 12.5 mg by mouth 4 (four) times daily as needed. Take 1-2 by mouth four times daily ad needed for dizziness      . DISCONTD: celecoxib (CELEBREX) 200 MG capsule Take 1 capsule (200 mg total) by mouth daily.  30 capsule  11   Assessment: S/p R TKA. Pre-op INR was 1.02. H/o DVT, taking ASA PTA.   Goal of Therapy:  INR 2-3   Plan:  Start with Coumadin 7.5mg  tonight. Check PT/INR daily. Provide Coumadin education.  Reece Packer 07/18/2011,6:23 PM

## 2011-07-18 NOTE — Interval H&P Note (Signed)
History and Physical Interval Note:  07/18/2011 12:17 PM  Sonia Dawson  has presented today for surgery, with the diagnosis of Osteoarthritis of the Right Knee  The various methods of treatment have been discussed with the patient and family. After consideration of risks, benefits and other options for treatment, the patient has consented to  Procedure(s): TOTAL KNEE ARTHROPLASTY as a surgical intervention .  The patients' history has been reviewed, patient examined, no change in status, stable for surgery.  I have reviewed the patients' chart and labs.  Questions were answered to the patient's satisfaction.     Stefanie Hodgens A

## 2011-07-18 NOTE — Transfer of Care (Signed)
Immediate Anesthesia Transfer of Care Note  Patient: Sonia Dawson  Procedure(s) Performed:  TOTAL KNEE ARTHROPLASTY  Patient Location: PACU  Anesthesia Type: General  Level of Consciousness: awake, alert , oriented and patient cooperative  Airway & Oxygen Therapy: Patient Spontanous Breathing and Patient connected to face mask oxygen  Post-op Assessment: Report given to PACU RN and Post -op Vital signs reviewed and stable  Post vital signs: Reviewed and stable  Complications: No apparent anesthesia complications

## 2011-07-18 NOTE — Op Note (Signed)
NAMECHICQUITA, MENDEL NO.:  [id number]  MEDICAL RECORD NO.:  [id number]  LOCATION:  [facility]                         FACILITY:  [facility]  PHYSICIAN:  Georges Lynch. Maizey Menendez, M.D.DATE OF BIRTH:  1951-01-13  DATE OF PROCEDURE:  07/18/2011 DATE OF DISCHARGE:                              OPERATIVE REPORT   SURGEON:  Georges Lynch. Darrelyn Hillock, M.D.  ASSISTANT:  Marlowe Kays, M.D.  PREOPERATIVE DIAGNOSIS:  Severe degenerative arthritis of the right knee with a valgus deformity.  POSTOPERATIVE DIAGNOSIS:  Severe degenerative arthritis of the right knee with a valgus deformity.  OPERATION:  DePuy right total knee arthroplasty utilizing size 2.5 right femur, 2.5 tibial tray with a size 10 thickness polyethylene insert. Note, after the 3 components were cemented we cemented the patella, which was a size 38 patella.  DESCRIPTION OF THE PROCEDURE:  Under general anesthesia, routine orthopedic prep and draping of the right lower extremity was carried out.  The appropriate time-out was carried out in the operating room. Prior to that, I marked the appropriate right leg.  At this time, the leg was exsanguinated and Esmarch tourniquet was elevated at 350 mm Hg. The leg was placed in the [facility] knee holder.  An anterior approach to the knee was carried out after we first exsanguinated the leg, then elevated the tourniquet to 350 mm Hg.  I created 2 flaps.  Following that, I carried out a median parapatellar incision.  Following that, I reflected the patella laterally, flexed the knee and did medial lateral meniscectomies.  She had a severe valgus deformity.  Note, this is an extremely complex case because of her obesity.  We utilized the revision tibial tray because of her weight.  We had Dr. Simonne Come assist in the retraction as well as in the overall procedure removing bone spurs etc. We then made our initial drill hole in the intercondylar notch.  The guidewire was inserted up the  femoral canal.  We then thoroughly water picked out the femur, femoral canal and removed 12 mm thickness off the distal femur because of her contracture.  We had the appropriate valgus angle of 5 degrees.  Following that, we then measured the femur to be a size 2.5.  We cut the appropriate cuts, the anterior and posterior chamfering cuts.  Following that, I went on and prepared the tibia in the usual fashion.  We utilized the intramedullary guidewire on the tibia.  We utilized the revision tibial tray.  Prior to making the appropriate keel cuts off the tibia, we first did a surface cut.  I removed 6 mm of thickness off the affected lateral side.  She had a severe valgus deformity.  We did then utilize spacer tension guides and had excellent tension, flexion and extension laterally and medially.  We then cut our keel cut off the tibia followed by a notch cut off the femur in the usual fashion.  The trial prosthesis was inserted.  At this particular time, we then went on and did a resurfacing procedure on the patella for a size 38 mm patella.  Drill holes were made in the patella. We then removed all the trial components,  thoroughly water picked out the knee, dried the knee and cemented all 3 components in simultaneously.  Now, we had excellent stability.  We then inserted a permanent 10 mm thickness rotating platform tray.  The knee was reduced. We injected 10 cc of FloSeal into the soft tissue structure for bleeding control.  We also injected 40 cc of Exparel into the knee soft tissue structures.  The wound was closed in a layered usual fashion over Hemovac drain.  Note that during the entire course of the procedure, Dr. Simonne Come assisted with opening and closing the wound as well as retraction as well as holding the prosthesis in position during the cementing.  Note, she had bone on bone.  The patient had 2 g of IV Ancef preop.  She left the operative room in satisfactory  condition.          ______________________________ Georges Lynch Darrelyn Hillock, M.D.     RAG/MEDQ  D:  07/18/2011  T:  07/18/2011  Job:  [id number]

## 2011-07-19 LAB — CBC
HCT: 32.4 % — ABNORMAL LOW (ref 36.0–46.0)
Hemoglobin: 10.9 g/dL — ABNORMAL LOW (ref 12.0–15.0)
MCH: 29.1 pg (ref 26.0–34.0)
MCV: 86.4 fL (ref 78.0–100.0)
RBC: 3.75 MIL/uL — ABNORMAL LOW (ref 3.87–5.11)
WBC: 10.3 10*3/uL (ref 4.0–10.5)

## 2011-07-19 LAB — BASIC METABOLIC PANEL
BUN: 14 mg/dL (ref 6–23)
CO2: 31 mEq/L (ref 19–32)
Calcium: 9.2 mg/dL (ref 8.4–10.5)
Chloride: 97 mEq/L (ref 96–112)
Creatinine, Ser: 1.32 mg/dL — ABNORMAL HIGH (ref 0.50–1.10)
Glucose, Bld: 142 mg/dL — ABNORMAL HIGH (ref 70–99)

## 2011-07-19 MED ORDER — WARFARIN SODIUM 7.5 MG PO TABS
7.5000 mg | ORAL_TABLET | Freq: Once | ORAL | Status: AC
  Administered 2011-07-19: 7.5 mg via ORAL
  Filled 2011-07-19: qty 1

## 2011-07-19 MED ORDER — METOPROLOL SUCCINATE ER 50 MG PO TB24
50.0000 mg | ORAL_TABLET | Freq: Three times a day (TID) | ORAL | Status: DC
  Administered 2011-07-19 – 2011-07-22 (×8): 50 mg via ORAL
  Filled 2011-07-19 (×10): qty 1

## 2011-07-19 MED ORDER — METOPROLOL SUCCINATE ER 50 MG PO TB24: 50.0000 mg | ORAL_TABLET | Freq: Every day | ORAL | Status: DC

## 2011-07-19 NOTE — Plan of Care (Signed)
Problem: Phase II Progression Outcomes Goal: Other Phase II Outcomes/Goals Outcome: Completed/Met Date Met:  07/19/11 Pain controlled with appropriate interventions.

## 2011-07-19 NOTE — Progress Notes (Signed)
Physical Therapy Treatment Patient Details Name: Sonia Dawson MRN: [id number] DOB: 1951/04/11 Today's Date: 07/19/2011  13:03-13:30 TA, TE  PT Assessment/Plan  PT - Assessment/Plan PT Plan: Discharge plan remains appropriate PT Frequency: 7X/week Follow Up Recommendations: Home health PT Equipment Recommended: None recommended by PT PT Goals  Acute Rehab PT Goals PT Goal Formulation: With patient/family Time For Goal Achievement: 7 days Pt will go Supine/Side to Sit: with modified independence PT Goal: Supine/Side to Sit - Progress: Progressing toward goal Pt will Transfer Sit to Stand/Stand to Sit: with modified independence;with upper extremity assist PT Transfer Goal: Sit to Stand/Stand to Sit - Progress: Progressing toward goal Pt will Ambulate: >150 feet;with modified independence;with rolling walker PT Goal: Ambulate - Progress: Progressing toward goal Pt will Perform Home Exercise Program: with min assist PT Goal: Perform Home Exercise Program - Progress: Progressing toward goal  PT Treatment Precautions/Restrictions  Precautions Precautions: Knee Knee Immobilizer: Other (comment) (not specified in orders) Restrictions Weight Bearing Restrictions: Yes RLE Weight Bearing: Weight bearing as tolerated Mobility (including Balance) Bed Mobility Bed Mobility: Yes Supine to Sit: 1: +2 Total assist Supine to Sit Details (indicate cue type and reason): pt 40%, assist to elevate trunk Sit to Supine - Right: 4: Min assist Sit to Supine - Right Details (indicate cue type and reason): assist for RLE, pt 80% Transfers Transfers: Yes Sit to Stand: From chair/3-in-1;With armrests;With upper extremity assist;3: Mod assist Sit to Stand Details (indicate cue type and reason): VCs hand placement, assist to achieve upright position Stand to Sit: 5: Supervision;To bed;Without upper extremity assist Stand to Sit Details: VCs hand placement Ambulation/Gait Ambulation/Gait:  Yes Ambulation/Gait Assistance: 4: Min assist Ambulation/Gait Assistance Details (indicate cue type and reason): min/guard for balance Ambulation Distance (Feet): 5 Feet (chair to bed) Assistive device: Rolling walker Gait Pattern: Step-to pattern Gait velocity: decreased due to pain and unfamiliarity with RW Stairs: No  Balance Balance Assessed: Yes Static Sitting Balance Static Sitting - Balance Support: Bilateral upper extremity supported;Feet supported Static Sitting - Level of Assistance: 6: Modified independent (Device/Increase time) Static Sitting - Comment/# of Minutes: 4 Exercise  Total Joint Exercises Ankle Circles/Pumps: AROM;Both;15 reps;Seated Quad Sets: AROM;Right;10 reps;Seated Towel Squeeze: AROM;Both;10 reps;Seated Short Arc Quad: AROM;Right;10 reps;Seated Heel Slides: AAROM;Right;10 reps;Seated Straight Leg Raises: AAROM;Right;10 reps;Seated End of Session PT - End of Session Equipment Utilized During Treatment: Gait belt Activity Tolerance: Patient tolerated treatment well Patient left: in chair;with call bell in reach;with family/visitor present Nurse Communication: Mobility status for transfers;Mobility status for ambulation General Behavior During Session: [facility] for tasks performed Cognition: [facility] for tasks performed  Tamala Ser 07/19/2011, 1:46 PM  Tamala Ser PT 07/19/2011  [phone]

## 2011-07-19 NOTE — Progress Notes (Addendum)
ANTICOAGULATION CONSULT NOTE - Follow up Consult  Pharmacy Consult for Coumadin Indication: VTE prophylaxis s/p TKA  Allergies  Allergen Reactions  . Erythromycin Nausea And Vomiting  . Sulfonamide Derivatives Nausea Only   Patient Measurements: Height: 5\' 6"  (167.6 cm) Weight: 266 lb (120.657 kg) IBW/kg (Calculated) : 59.3   Vital Signs: Temp: 98.2 F (36.8 C) (11/29 1016) Temp src: Oral (11/29 1016) BP: 135/86 mmHg (11/29 1016) Pulse Rate: 67  (11/29 1016)  Labs:  [facility] 07/19/11 0432  HGB 10.9*  HCT 32.4*  PLT 184  APTT --  LABPROT 14.4  INR 1.10  HEPARINUNFRC --  CREATININE 1.32*  CKTOTAL --  CKMB --  TROPONINI --   Estimated Creatinine Clearance: 60.8 ml/min (by C-G formula based on Cr of 1.32).  Medical History: Past Medical History  Diagnosis Date  . Hypertension   . History of DVT (deep vein thrombosis) 2007    right leg  . Hypothyroidism   . Fatty liver   . Arthritis   . COPD (chronic obstructive pulmonary disease)     pt denies copd  . Asthma     pt denies asthma, says hx of bronchitis  . GERD (gastroesophageal reflux disease)   . Anxiety   . Depression   . PONV (postoperative nausea and vomiting)    Medications:  Prescriptions prior to admission  Medication Sig Dispense Refill  . ALPRAZolam (XANAX) 0.5 MG tablet Take 1 tablet (0.5 mg total) by mouth 3 (three) times daily as needed for sleep or anxiety.  75 tablet  3  . buPROPion (WELLBUTRIN XL) 300 MG 24 hr tablet       . clonazePAM (KLONOPIN) 0.5 MG tablet Take 1 tablet (0.5 mg total) by mouth 2 (two) times daily as needed for anxiety. 2 bid  60 tablet  5  . levothyroxine (SYNTHROID) 150 MCG tablet       . metoprolol (TOPROL-XL) 50 MG 24 hr tablet 50 mg 3 (three) times daily.       . pantoprazole (PROTONIX) 40 MG tablet TAKE ONE TABLET BY MOUTH ONE TIME DAILY  90 tablet  1  . sertraline (ZOLOFT) 100 MG tablet       . DISCONTD: aspirin (BAYER ASPIRIN EC LOW DOSE) 81 MG EC tablet Take 81  mg by mouth every morning.       Marland Kitchen allopurinol (ZYLOPRIM) 300 MG tablet Take 300 mg by mouth daily as needed. GOUT       . budesonide-formoterol (SYMBICORT) 160-4.5 MCG/ACT inhaler Inhale 2 puffs into the lungs 2 (two) times daily as needed. WHEEZING       . meclizine (ANTIVERT) 12.5 MG tablet Take 12.5 mg by mouth 4 (four) times daily as needed. Take 1-2 by mouth four times daily ad needed for dizziness      . DISCONTD: celecoxib (CELEBREX) 200 MG capsule Take 1 capsule (200 mg total) by mouth daily.  30 capsule  11  Coagulation: Heparin 5000 units SQ Q12 hrs 11/28 -  Coumadin 7.5 mg on 11/28  Assessment: 60 yo F s/p TKA on 11/28 INR subtherapeutic as expected after only one dose of Coumadin.   Goal of Therapy:  INR 2-3   Plan:  Coumadin 7.5mg  PO today. Follow daily PT/INR and CBC. Continue heparin bridge until INR therapeutic Coumadin education.  Lynann Beaver PharmD  Pager [phone] 07/19/2011 1:13 PM

## 2011-07-19 NOTE — Progress Notes (Signed)
Subjective: Venous drainage from Drain site.She is doing fine and not in severe pain.   Objective: Vital signs in last 24 hours: Temp:  [97.4 F (36.3 C)-98.9 F (37.2 C)] 98.2 F (36.8 C) (11/29 0449) Pulse Rate:  [58-70] 63  (11/29 0449) Resp:  [11-18] 18  (11/29 0449) BP: (94-155)/(53-85) 102/72 mmHg (11/29 0449) SpO2:  [93 %-100 %] 99 % (11/29 0449) Weight:  [120.657 kg (266 lb)] 266 lb (120.657 kg) (11/28 1810)  Intake/Output from previous day: 11/28 0701 - 11/29 0700 In: 4172.7 [P.O.:1000; I.V.:3022.7; IV Piggyback:150] Out: 1310 [Urine:700; Drains:460; Blood:150] Intake/Output this shift:     Basename 07/19/11 0432  HGB 10.9*    Basename 07/19/11 0432  WBC 10.3  RBC 3.75*  HCT 32.4*  PLT 184    Basename 07/19/11 0432  NA 138  K 3.4*  CL 97  CO2 31  BUN 14  CREATININE 1.32*  GLUCOSE 142*  CALCIUM 9.2    Basename 07/19/11 0432  LABPT --  INR 1.10    Neurologically intact Dorsiflexion/Plantar flexion intact Incision: dressing C/D/I  Assessment/Plan: Up Today    Sonia Dawson 07/19/2011, 7:19 AM

## 2011-07-19 NOTE — Progress Notes (Signed)
Physical Therapy Evaluation Patient Details Name: Sonia Dawson MRN: [id number] DOB: 22-Dec-1950 Today's Date: 07/19/2011  10:25-11:05 Eval 2  Problem List:  Patient Active Problem List  Diagnoses  . HYPOTHYROIDISM  . OBESITY, UNSPECIFIED  . SMOKER  . HYPERTENSION  . COPD  . OSTEOARTHRITIS, GENERALIZED, MULTIPLE JOINTS  . HYPERGLYCEMIA, BORDERLINE  . DVT, HX OF  . VITAMIN D DEFICIENCY  . FATTY LIVER DISEASE  . Asthma  . Anxiety  . Osteoarthritis of right knee    Past Medical History:  Past Medical History  Diagnosis Date  . Hypertension   . History of DVT (deep vein thrombosis) 2007    right leg  . Hypothyroidism   . Fatty liver   . Arthritis   . COPD (chronic obstructive pulmonary disease)     pt denies copd  . Asthma     pt denies asthma, says hx of bronchitis  . GERD (gastroesophageal reflux disease)   . Anxiety   . Depression   . PONV (postoperative nausea and vomiting)    Past Surgical History:  Past Surgical History  Procedure Date  . Stents 2007    in both inguinals   . Tonsillectomy age 73  . Nasal reconstruction 1971  . Bone cyst excision 2004 or 2005    right ankle cyst removed  . Meniscus 2010    left meniscus repair  . Tubal ligation 1979  . Ankle ganglion cyst excision 1981    left wrist  . Carpal tunnel release 2008    left    PT Assessment/Plan/Recommendation PT Assessment Clinical Impression Statement: Pt is pleasant and motiviated to progress with mobility. Expect excellent progress, did very well with ambulation and exercises. Has 24 hour assist at home. PT Recommendation/Assessment: Patient will need skilled PT in the acute care venue PT Problem List: Decreased strength;Decreased range of motion;Decreased activity tolerance;Decreased mobility;Decreased knowledge of use of DME Barriers to Discharge: None PT Therapy Diagnosis : Difficulty walking;Acute pain PT Plan PT Frequency: 7X/week PT Treatment/Interventions: DME  instruction;Gait training;Functional mobility training;Therapeutic exercise;Patient/family education PT Recommendation Recommendations for Other Services: OT consult Follow Up Recommendations: Home health PT Equipment Recommended: None recommended by PT (Pt has needed DME) PT Goals  Acute Rehab PT Goals PT Goal Formulation: With patient/family Time For Goal Achievement: 7 days Pt will go Supine/Side to Sit: with modified independence PT Goal: Supine/Side to Sit - Progress: Progressing toward goal Pt will Transfer Sit to Stand/Stand to Sit: with modified independence;with upper extremity assist PT Transfer Goal: Sit to Stand/Stand to Sit - Progress: Progressing toward goal Pt will Ambulate: >150 feet;with modified independence;with rolling walker PT Goal: Ambulate - Progress: Progressing toward goal Pt will Perform Home Exercise Program: with min assist PT Goal: Perform Home Exercise Program - Progress: Progressing toward goal  PT Evaluation Precautions/Restrictions  Restrictions Weight Bearing Restrictions: Yes RLE Weight Bearing: Weight bearing as tolerated Prior Functioning  Home Living Lives With: Other (Comment) (lives with father, daughter will assist as well) Receives Help From: Family Type of Home: House Home Access: Ramped entrance Home Adaptive Equipment: Walker - standard;Walker - rolling;Walker - four wheeled;Straight Archivist;Shower chair with back;Bedside commode/3-in-1;Quad cane Prior Function Level of Independence: Requires assistive device for independence (used SPC for ambulation) Cognition Cognition Arousal/Alertness: Awake/alert Overall Cognitive Status: Appears within functional limits for tasks assessed Orientation Level: Oriented X4 Sensation/Coordination Sensation Light Touch: Appears Intact Coordination Gross Motor Movements are Fluid and Coordinated: Yes Fine Motor Movements are Fluid and Coordinated: Yes Extremity Assessment RUE  Assessment RUE Assessment: Within Functional Limits LUE Assessment LUE Assessment: Within Functional Limits RLE Assessment RLE Assessment: Within Functional Limits LLE Assessment LLE Assessment: Exceptions to WFL LLE AROM (degrees) Left Knee Flexion 0-140: 45  (AAROM) LLE Strength Left Hip Flexion: 3/5 (SLR) Left Knee Extension: 3-/5 Mobility (including Balance) Bed Mobility Bed Mobility: Yes Supine to Sit: 1: +2 Total assist Supine to Sit Details (indicate cue type and reason): pt 40%, assist to elevate trunk Transfers Transfers: Yes Sit to Stand: 2: Max assist;From bed;With upper extremity assist Sit to Stand Details (indicate cue type and reason): pt 40%, assist to achieve upright position, VCs hand placement Stand to Sit: 4: Min assist;To chair/3-in-1;With armrests;With upper extremity assist Stand to Sit Details: VCs for hand placement, assist to control descent Ambulation/Gait Ambulation/Gait: Yes Ambulation/Gait Assistance: 4: Min assist Ambulation/Gait Assistance Details (indicate cue type and reason): Frequent VCs for sequencing, min/guard assist for balance Ambulation Distance (Feet): 26 Feet Assistive device: Rolling walker Gait Pattern: Step-to pattern Gait velocity: decreased due to pain and unfamiliarity with RW Stairs: No  Balance Balance Assessed: Yes Static Sitting Balance Static Sitting - Balance Support: Bilateral upper extremity supported;Feet supported Static Sitting - Level of Assistance: 6: Modified independent (Device/Increase time) Static Sitting - Comment/# of Minutes: 4 Exercise  Total Joint Exercises Ankle Circles/Pumps: AROM;Both;15 reps;Sidelying Quad Sets: AROM;Right;5 reps;Seated Heel Slides: AAROM;Right;10 reps;Supine Straight Leg Raises: Right;AROM;5 reps End of Session PT - End of Session Equipment Utilized During Treatment: Gait belt Activity Tolerance: Patient tolerated treatment well Patient left: in chair;with call bell in  reach;with family/visitor present Nurse Communication: Mobility status for transfers;Mobility status for ambulation General Behavior During Session: [facility] for tasks performed Cognition: [facility] for tasks performed  Tamala Ser [phone] 07/19/2011, 11:29 AM

## 2011-07-20 ENCOUNTER — Encounter (HOSPITAL_COMMUNITY): Payer: Self-pay | Admitting: Orthopedic Surgery

## 2011-07-20 LAB — CBC
Hemoglobin: 10.3 g/dL — ABNORMAL LOW (ref 12.0–15.0)
RBC: 3.46 MIL/uL — ABNORMAL LOW (ref 3.87–5.11)
WBC: 9.3 10*3/uL (ref 4.0–10.5)

## 2011-07-20 LAB — BASIC METABOLIC PANEL
CO2: 29 mEq/L (ref 19–32)
Chloride: 95 mEq/L — ABNORMAL LOW (ref 96–112)
Glucose, Bld: 123 mg/dL — ABNORMAL HIGH (ref 70–99)
Potassium: 3 mEq/L — ABNORMAL LOW (ref 3.5–5.1)
Sodium: 132 mEq/L — ABNORMAL LOW (ref 135–145)

## 2011-07-20 LAB — PROTIME-INR
INR: 1.25 (ref 0.00–1.49)
Prothrombin Time: 16 seconds — ABNORMAL HIGH (ref 11.6–15.2)

## 2011-07-20 MED ORDER — WARFARIN SODIUM 7.5 MG PO TABS
7.5000 mg | ORAL_TABLET | Freq: Once | ORAL | Status: AC
  Administered 2011-07-20: 7.5 mg via ORAL
  Filled 2011-07-20: qty 1

## 2011-07-20 NOTE — Progress Notes (Signed)
Physical Therapy Treatment Patient Details Name: Sonia Dawson MRN: [id number] DOB: April 08, 1951 Today's Date: 07/20/2011 Time: 9528-4132 Charge: Leonia Reeves PT Assessment/Plan  PT - Assessment/Plan Comments on Treatment Session: Pt assisted to raised commode requiring Min A for transfers.  Pt ambulated in hallway and able to slighty increase distance this afternoon. PT Plan: Discharge plan remains appropriate Follow Up Recommendations: Home health PT Equipment Recommended: None recommended by PT PT Goals  Acute Rehab PT Goals PT Goal: Supine/Side to Sit - Progress: Progressing toward goal PT Transfer Goal: Sit to Stand/Stand to Sit - Progress (DO NOT USE): Progressing toward goal PT Goal: Ambulate - Progress: Progressing toward goal  PT Treatment Precautions/Restrictions  Precautions Precautions: Knee Required Braces or Orthoses: Yes Knee Immobilizer: Other (comment) (KI with amulation) Restrictions Weight Bearing Restrictions: No RLE Weight Bearing: Weight bearing as tolerated Mobility (including Balance) Bed Mobility Bed Mobility: Yes Supine to Sit: 4: Min assist Supine to Sit Details (indicate cue type and reason): verbal cues for technique, assist for R LE Sit to Supine - Right: 4: Min assist Sit to Supine - Right Details (indicate cue type and reason): assist for R LE Transfers Transfers: Yes Sit to Stand: 4: Min assist;From elevated surface;With upper extremity assist;From bed;From toilet Sit to Stand Details (indicate cue type and reason): verbal cues for R LE forward and hand placement Stand to Sit: 4: Min assist;With upper extremity assist;To elevated surface;To bed;To toilet Stand to Sit Details: assist to control descent, verbal cues for hand placement Ambulation/Gait Ambulation/Gait: Yes Ambulation/Gait Assistance: 5: Supervision Ambulation/Gait Assistance Details (indicate cue type and reason): 40 x2 with standing rest break 2* to UE fatigue, verbal cues for  posture Ambulation Distance (Feet): 80 Feet Assistive device: Rolling walker Gait Pattern: Step-to pattern    Exercise    End of Session PT - End of Session Activity Tolerance: Patient tolerated treatment well Patient left: in bed;with call bell in reach;with family/visitor present General Behavior During Session: [facility] for tasks performed Cognition: [facility] for tasks performed  Keari Miu,KATHrine E 07/20/2011, 4:19 PM Pager: [phone]

## 2011-07-20 NOTE — Progress Notes (Signed)
ANTICOAGULATION CONSULT NOTE - Follow up Consult  Pharmacy Consult for Coumadin Indication: VTE prophylaxis s/p TKA  Allergies  Allergen Reactions  . Erythromycin Nausea And Vomiting  . Sulfonamide Derivatives Nausea Only   Patient Measurements: Height: 5\' 6"  (167.6 cm) Weight: 266 lb (120.657 kg) IBW/kg (Calculated) : 59.3   Vital Signs: Temp: 98.5 F (36.9 C) (11/30 0550) Temp src: Oral (11/30 0550) BP: 122/82 mmHg (11/30 0550) Pulse Rate: 66  (11/30 0550)  Labs:  Basename 07/20/11 0526 07/19/11 0432  HGB 10.3* 10.9*  HCT 29.5* 32.4*  PLT 177 184  APTT -- --  LABPROT 16.0* 14.4  INR 1.25 1.10  HEPARINUNFRC -- --  CREATININE 1.39* 1.32*  CKTOTAL -- --  CKMB -- --  TROPONINI -- --   Estimated Creatinine Clearance: 57.7 ml/min (by C-G formula based on Cr of 1.39).  Medical History: Past Medical History  Diagnosis Date  . Hypertension   . History of DVT (deep vein thrombosis) 2007    right leg  . Hypothyroidism   . Fatty liver   . Arthritis   . COPD (chronic obstructive pulmonary disease)     pt denies copd  . Asthma     pt denies asthma, says hx of bronchitis  . GERD (gastroesophageal reflux disease)   . Anxiety   . Depression   . PONV (postoperative nausea and vomiting)    Medications:  Prescriptions prior to admission  Medication Sig Dispense Refill  . ALPRAZolam (XANAX) 0.5 MG tablet Take 1 tablet (0.5 mg total) by mouth 3 (three) times daily as needed for sleep or anxiety.  75 tablet  3  . buPROPion (WELLBUTRIN XL) 300 MG 24 hr tablet       . clonazePAM (KLONOPIN) 0.5 MG tablet Take 1 tablet (0.5 mg total) by mouth 2 (two) times daily as needed for anxiety. 2 bid  60 tablet  5  . levothyroxine (SYNTHROID) 150 MCG tablet       . metoprolol (TOPROL-XL) 50 MG 24 hr tablet Take 50 mg by mouth 3 (three) times daily. Verifed with patient that she takes sustained release product three times a day, presciption instructions/pharmacy are once daily. Chilton Si,  Terri L 7:41 PM       . pantoprazole (PROTONIX) 40 MG tablet TAKE ONE TABLET BY MOUTH ONE TIME DAILY  90 tablet  1  . sertraline (ZOLOFT) 100 MG tablet       . DISCONTD: aspirin (BAYER ASPIRIN EC LOW DOSE) 81 MG EC tablet Take 81 mg by mouth every morning.       Marland Kitchen allopurinol (ZYLOPRIM) 300 MG tablet Take 300 mg by mouth daily as needed. GOUT       . budesonide-formoterol (SYMBICORT) 160-4.5 MCG/ACT inhaler Inhale 2 puffs into the lungs 2 (two) times daily as needed. WHEEZING       . meclizine (ANTIVERT) 12.5 MG tablet Take 12.5 mg by mouth 4 (four) times daily as needed. Take 1-2 by mouth four times daily ad needed for dizziness      . DISCONTD: celecoxib (CELEBREX) 200 MG capsule Take 1 capsule (200 mg total) by mouth daily.  30 capsule  11  Coagulation: Heparin 5000 units SQ Q12 hrs 11/28 -  Coumadin 7.5 mg on 11/28, 11/29  Assessment: 60 yo F s/p TKA on 11/28 INR subtherapeutic, but increasing with Coumadin initiation Coumadin education completed.  Goal of Therapy:  INR 2-3   Plan:  Coumadin 7.5mg  PO today. Follow daily PT/INR and CBC. Continue heparin  bridge until INR therapeutic   Lynann Beaver PharmD  Pager [phone] 07/20/2011 9:10 AM

## 2011-07-20 NOTE — Progress Notes (Signed)
Subjective: Dressing Dry. Doing Well.   Objective: Vital signs in last 24 hours: Temp:  [98.2 F (36.8 C)-98.5 F (36.9 C)] 98.5 F (36.9 C) (11/30 0550) Pulse Rate:  [66-76] 66  (11/30 0550) Resp:  [12-16] 16  (11/30 0550) BP: (122-135)/(82-92) 122/82 mmHg (11/30 0550) SpO2:  [96 %-97 %] 97 % (11/30 0550)  Intake/Output from previous day: 11/29 0701 - 11/30 0700 In: 4710 [P.O.:2260; I.V.:2400; IV Piggyback:50] Out: 1325 [Urine:1325] Intake/Output this shift: Total I/O In: 2460 [P.O.:1260; I.V.:1200] Out: 1050 [Urine:1050]   Basename 07/20/11 0526 07/19/11 0432  HGB 10.3* 10.9*    Basename 07/20/11 0526 07/19/11 0432  WBC 9.3 10.3  RBC 3.46* 3.75*  HCT 29.5* 32.4*  PLT 177 184    Basename 07/20/11 0526 07/19/11 0432  NA 132* 138  K 3.0* 3.4*  CL 95* 97  CO2 29 31  BUN 16 14  CREATININE 1.39* 1.32*  GLUCOSE 123* 142*  CALCIUM 9.1 9.2    Basename 07/20/11 0526 07/19/11 0432  LABPT -- --  INR 1.25 1.10    Neurologically intact Neurovascular intact Dorsiflexion/Plantar flexion intact  Assessment/Plan: DC Plans for Sunday.   Denard Tuminello A 07/20/2011, 6:59 AM

## 2011-07-20 NOTE — Progress Notes (Signed)
Physical Therapy Treatment Patient Details Name: Sonia Dawson MRN: [id number] DOB: 02-Jan-1951 Today's Date: 07/20/2011  11:15-11:45 G, TE  PT Assessment/Plan  PT - Assessment/Plan Comments on Treatment Session: Pt progressing well. Required anti-nausea medication prior to tx.  PT Plan: Discharge plan remains appropriate PT Frequency: 7X/week Follow Up Recommendations: Home health PT Equipment Recommended: None recommended by OT PT Goals  Acute Rehab PT Goals PT Goal Formulation: With patient/family Time For Goal Achievement: 7 days Pt will go Supine/Side to Sit: with modified independence PT Goal: Supine/Side to Sit - Progress: Progressing toward goal Pt will Transfer Sit to Stand/Stand to Sit: with modified independence;with upper extremity assist PT Transfer Goal: Sit to Stand/Stand to Sit - Progress: Progressing toward goal Pt will Ambulate: >150 feet;with modified independence;with rolling walker PT Goal: Ambulate - Progress: Progressing toward goal Pt will Perform Home Exercise Program: with min assist PT Goal: Perform Home Exercise Program - Progress: Progressing toward goal  PT Treatment Precautions/Restrictions  Precautions Precautions: Knee Required Braces or Orthoses: Yes Knee Immobilizer: Other (comment) (KI on; used for walking) Restrictions Weight Bearing Restrictions: No RLE Weight Bearing: Weight bearing as tolerated Mobility (including Balance) Bed Mobility Bed Mobility: Yes Supine to Sit: 4: Min assist Sit to Supine - Right: 4: Min assist Sit to Supine - Right Details (indicate cue type and reason): min A for RLE, pt 90% Transfers Sit to Stand: 5: Supervision;With upper extremity assist;From chair/3-in-1;With armrests Sit to Stand Details (indicate cue type and reason): VCs hand placement Stand to Sit: 5: Supervision;To bed;With upper extremity assist;With armrests Stand to Sit Details: VCs hand placement Ambulation/Gait Ambulation/Gait:  Yes Ambulation/Gait Assistance: 5: Supervision Ambulation/Gait Assistance Details (indicate cue type and reason): VCs for flexed neck Ambulation Distance (Feet): 65 Feet Assistive device: Rolling walker Gait Pattern: Step-to pattern Stairs: No    Exercise  Total Joint Exercises Ankle Circles/Pumps: AROM;Both;15 reps;Seated Quad Sets: AROM;Right;10 reps;Seated Short Arc Quad: AROM;Right;10 reps;Seated Heel Slides: AAROM;Right;10 reps;Seated Straight Leg Raises: AAROM;Right;10 reps;Seated End of Session PT - End of Session Equipment Utilized During Treatment: Gait belt Activity Tolerance: Patient tolerated treatment well Patient left: in chair;with call bell in reach;with family/visitor present Nurse Communication: Mobility status for transfers;Mobility status for ambulation General Behavior During Session: [facility] for tasks performed Cognition: [facility] for tasks performed  Tamala Ser 07/20/2011, 11:53 AM Tamala Ser PT 07/20/2011  [phone]

## 2011-07-20 NOTE — Progress Notes (Signed)
CM consult. Patient plans to go back to her home in [place], [place] where her father and daughter will be her caregivers. Already has RW and bsc. Wants [facility] for [facility] and pt

## 2011-07-20 NOTE — Progress Notes (Signed)
Occupational Therapy Evaluation Patient Details Name: Sonia Dawson MRN: [id number] DOB: 11-08-1950 Today's Date: 07/20/2011 0830 0904 ev2  Problem List:  Patient Active Problem List  Diagnoses  . HYPOTHYROIDISM  . OBESITY, UNSPECIFIED  . SMOKER  . HYPERTENSION  . COPD  . OSTEOARTHRITIS, GENERALIZED, MULTIPLE JOINTS  . HYPERGLYCEMIA, BORDERLINE  . DVT, HX OF  . VITAMIN D DEFICIENCY  . FATTY LIVER DISEASE  . Asthma  . Anxiety  . Osteoarthritis of right knee    Past Medical History:  Past Medical History  Diagnosis Date  . Hypertension   . History of DVT (deep vein thrombosis) 2007    right leg  . Hypothyroidism   . Fatty liver   . Arthritis   . COPD (chronic obstructive pulmonary disease)     pt denies copd  . Asthma     pt denies asthma, says hx of bronchitis  . GERD (gastroesophageal reflux disease)   . Anxiety   . Depression   . PONV (postoperative nausea and vomiting)    Past Surgical History:  Past Surgical History  Procedure Date  . Stents 2007    in both inguinals   . Tonsillectomy age 67  . Nasal reconstruction 1971  . Bone cyst excision 2004 or 2005    right ankle cyst removed  . Meniscus 2010    left meniscus repair  . Tubal ligation 1979  . Ankle ganglion cyst excision 1981    left wrist  . Carpal tunnel release 2008    left  . Total knee arthroplasty 07/18/2011    Procedure: TOTAL KNEE ARTHROPLASTY;  Surgeon: Jacki Cones;  Location: [facility] ORS;  Service: Orthopedics;  Laterality: Right;    OT Assessment/Plan/Recommendation OT Assessment Clinical Impression Statement: Pt is a 60 year old female s/p left TKA.  She has A at home but wants to be as I with ADLS as possible.  She has DME/AE from her mother (who passed away in 01-May-2023).  Pt verbalizes all education and does not need reinforcement.  No further OT needs at this time.   OT Recommendation/Assessment: Patient does not need any further OT services OT Recommendation Equipment Recommended:  None recommended by OT OT Goals    OT Evaluation Precautions/Restrictions  Precautions Precautions: Knee Required Braces or Orthoses: Yes Knee Immobilizer: Other (comment) (KI on; used for walking) Restrictions Weight Bearing Restrictions: No RLE Weight Bearing: Weight bearing as tolerated Prior Functioning Home Living Bathroom Shower/Tub: Walk-in shower (ledge is smaller than one in hospital) Bathroom Toilet: Handicapped height Prior Function Driving: Yes ADL ADL Grooming: Performed;Supervision/safety Where Assessed - Grooming: Standing at sink Upper Body Bathing: Performed;Set up Where Assessed - Upper Body Bathing: Sitting, chair;Supported Lower Body Bathing: Performed;Maximal assistance; Did not use AE Where Assessed - Lower Body Bathing: Sit to stand from chair;Other (comment) (3:1 commode; pt had difficulty reaching peri area in stand) Upper Body Dressing: Performed;Minimal assistance;Other (comment) (IV) Where Assessed - Upper Body Dressing: Sitting, chair;Supported Lower Body Dressing: Performed;Moderate assistance;Other (comment) (socks; educated on sock aid; pt has Sports administrator) Where Assessed - Lower Body Dressing: Sit to stand from chair Toilet Transfer: Performed;Minimal assistance;Other (comment) (min guard ambulation; min A sit to stand) Toilet Transfer Method: Proofreader: Raised toilet seat with arms (or 3-in-1 over toilet) Toileting - Clothing Manipulation: Simulated;Minimal assistance Where Assessed - Glass blower/designer Manipulation: Standing Toileting - Hygiene: Simulated;Maximal assistance Where Assessed - Toileting Hygiene: Standing Tub/Shower Transfer: Other (comment) (educated on; pt wants to wait to use hers which  is lower) Equipment Used: Sock aid ADL Comments: Pt verbalizes all education; discussed cleaning peri area with handheld shower or lying in bed if she cannot reach well in standing; verbalizes use of AE and shower  sequence Vision/Perception  Vision - History Patient Visual Report: No change from baseline Cognition Cognition Overall Cognitive Status: Appears within functional limits for tasks assessed Orientation Level: Oriented X4 Sensation/Coordination   Extremity Assessment RUE Assessment RUE Assessment: Within Functional Limits LUE Assessment LUE Assessment: Within Functional Limits Mobility  Bed Mobility Bed Mobility: Yes Supine to Sit: 4: Min assist Transfers Transfers: Yes Sit to Stand: 4: Min assist Stand to Sit: 4: Min assist Exercises   End of Session OT - End of Session Activity Tolerance: Patient tolerated treatment well Patient left: in chair;with call bell in reach General Behavior During Session: [facility] for tasks performed Cognition: [facility] for tasks performed   Shambhavi Salley  [phone] 07/20/2011, 10:19 AM

## 2011-07-21 LAB — PROTIME-INR
INR: 2.11 — ABNORMAL HIGH (ref 0.00–1.49)
Prothrombin Time: 24 seconds — ABNORMAL HIGH (ref 11.6–15.2)

## 2011-07-21 LAB — CBC
HCT: 27.1 % — ABNORMAL LOW (ref 36.0–46.0)
Hemoglobin: 9.2 g/dL — ABNORMAL LOW (ref 12.0–15.0)
RDW: 16.7 % — ABNORMAL HIGH (ref 11.5–15.5)
WBC: 8.1 10*3/uL (ref 4.0–10.5)

## 2011-07-21 MED ORDER — WARFARIN SODIUM 2.5 MG PO TABS
2.5000 mg | ORAL_TABLET | Freq: Once | ORAL | Status: AC
  Administered 2011-07-21: 2.5 mg via ORAL
  Filled 2011-07-21: qty 1

## 2011-07-21 NOTE — Progress Notes (Signed)
Physical Therapy Treatment Patient Details Name: Sonia Dawson MRN: [id number] DOB: 23-Dec-1950 Today's Date: 07/21/2011  10:20-10:55 G, TE  PT Assessment/Plan  PT - Assessment/Plan Comments on Treatment Session: Pt progressing well with increased gait distance. Expect will be ready for DC tomorrow.  PT Plan: Discharge plan remains appropriate PT Frequency: 7X/week Follow Up Recommendations: Home health PT Equipment Recommended: None recommended by PT PT Goals  Acute Rehab PT Goals PT Goal Formulation: With patient/family Time For Goal Achievement: 7 days Pt will go Supine/Side to Sit: with modified independence Pt will Ambulate: >150 feet;with modified independence;with rolling walker PT Goal: Ambulate - Progress: Progressing toward goal Pt will Perform Home Exercise Program: with min assist PT Goal: Perform Home Exercise Program - Progress: Met  PT Treatment Precautions/Restrictions  Precautions Precautions: Knee Required Braces or Orthoses: Yes Knee Immobilizer: On when out of bed or walking Restrictions Weight Bearing Restrictions: No RLE Weight Bearing: Weight bearing as tolerated Mobility (including Balance) Bed Mobility Bed Mobility: No Transfers Transfers: Yes Stand to Sit: 5: Supervision;To chair/3-in-1;With armrests;With upper extremity assist (VCs hand placement) Ambulation/Gait Ambulation/Gait: Yes Ambulation/Gait Assistance: 6: Modified independent (Device/Increase time) Ambulation Distance (Feet): 115 Feet Assistive device: Rolling walker Gait Pattern: Step-to pattern Stairs: No Wheelchair Mobility Wheelchair Mobility: No    Exercise  Total Joint Exercises Ankle Circles/Pumps: AROM;Both;15 reps;Seated Quad Sets: AROM;Right;10 reps;Seated Short Arc Quad: AROM;Strengthening;Right;Seated;Other reps (comment) (15) Heel Slides: AAROM;Right;10 reps;Seated Hip ABduction/ADduction: AAROM;Right;10 reps;Seated Straight Leg Raises: AAROM;Right;10  reps;Seated End of Session PT - End of Session Equipment Utilized During Treatment: Gait belt Activity Tolerance: Patient tolerated treatment well Patient left: in chair;with call bell in reach;with family/visitor present Nurse Communication: Mobility status for transfers;Mobility status for ambulation General Behavior During Session: [facility] for tasks performed Cognition: [facility] for tasks performed  Tamala Ser 07/21/2011, 11:05 AM

## 2011-07-21 NOTE — Progress Notes (Signed)
ANTICOAGULATION CONSULT NOTE - Follow up Consult  Pharmacy Consult for Coumadin Indication: VTE prophylaxis s/p TKA  Allergies  Allergen Reactions  . Erythromycin Nausea And Vomiting  . Sulfonamide Derivatives Nausea Only   Patient Measurements: Height: 5\' 6"  (167.6 cm) Weight: 266 lb (120.657 kg) IBW/kg (Calculated) : 59.3   Vital Signs: Temp: 98.7 F (37.1 C) (12/01 0610) Temp src: Oral (12/01 0610) BP: 113/69 mmHg (12/01 1047) Pulse Rate: 64  (12/01 1047)  Labs:  Basename 07/21/11 0441 07/20/11 0526 07/19/11 0432  HGB 9.2* 10.3* --  HCT 27.1* 29.5* 32.4*  PLT 165 177 184  APTT -- -- --  LABPROT 24.0* 16.0* 14.4  INR 2.11* 1.25 1.10  HEPARINUNFRC -- -- --  CREATININE -- 1.39* 1.32*  CKTOTAL -- -- --  CKMB -- -- --  TROPONINI -- -- --   Estimated Creatinine Clearance: 57.7 ml/min (by C-G formula based on Cr of 1.39).  Medical History: Past Medical History  Diagnosis Date  . Hypertension   . History of DVT (deep vein thrombosis) 2007    right leg  . Hypothyroidism   . Fatty liver   . Arthritis   . COPD (chronic obstructive pulmonary disease)     pt denies copd  . Asthma     pt denies asthma, says hx of bronchitis  . GERD (gastroesophageal reflux disease)   . Anxiety   . Depression   . PONV (postoperative nausea and vomiting)    Coagulation: Discontinued: Heparin 5000 units SQ Q12 hrs 11/28 -  Coumadin 7.5 mg on 11/28, 11/29 I Assessment: 60 yo F s/p TKA on 11/28 INR therapeutic, large increase Coumadin education completed.  Goal of Therapy:  INR 2-3   Plan:  Coumadin 2wa.5mg  PO today. Follow daily PT/INR and CBC. Heparin d/c  07/21/2011 12:27 PM

## 2011-07-21 NOTE — Progress Notes (Signed)
Afternoon PT held, pt feeling nauseous. RN aware. Will follow.   Ralene Bathe Kistler PT 07/21/2011  [phone]

## 2011-07-22 LAB — PROTIME-INR
INR: 2.29 — ABNORMAL HIGH (ref 0.00–1.49)
Prothrombin Time: 25.6 seconds — ABNORMAL HIGH (ref 11.6–15.2)

## 2011-07-22 NOTE — Progress Notes (Signed)
Physical Therapy Treatment Patient Details Name: Sonia Dawson MRN: [id number] DOB: June 28, 1951 Today's Date: 07/22/2011 910-930 g PT Assessment/Plan  PT - Assessment/Plan Comments on Treatment Session: pt feels bad r/t nausea. pt wants to go home PT Plan: Discharge plan remains appropriate PT Frequency: 7X/week Follow Up Recommendations: Home health PT Equipment Recommended: None recommended by PT PT Goals  Acute Rehab PT Goals Pt will go Supine/Side to Sit: with modified independence PT Goal: Supine/Side to Sit - Progress: Progressing toward goal Pt will Ambulate: >150 feet;with modified independence;with rolling walker PT Goal: Ambulate - Progress: Progressing toward goal Pt will Perform Home Exercise Program: with min assist PT Goal: Perform Home Exercise Program - Progress: Progressing toward goal  PT Treatment Precautions/Restrictions  Precautions Precautions: Knee Required Braces or Orthoses: Yes Knee Immobilizer: On when out of bed or walking Restrictions Weight Bearing Restrictions: No RLE Weight Bearing: Weight bearing as tolerated Mobility (including Balance) Bed Mobility Bed Mobility: Yes Supine to Sit: 4: Min assist Supine to Sit Details (indicate cue type and reason): VC to try not to use rail Transfers Transfers: Yes Sit to Stand: 5: Supervision;From bed;From chair/3-in-1 Sit to Stand Details (indicate cue type and reason): vc to push from surface Stand to Sit: 5: Supervision;To chair/3-in-1;With upper extremity assist Ambulation/Gait Ambulation/Gait: Yes Ambulation/Gait Assistance: 5: Supervision Ambulation Distance (Feet): 20 Feet (x 2, pt feels bad and does not want to walk, for DC) Assistive device: Rolling walker Gait Pattern: Step-to pattern    Exercise  Total Joint Exercises Ankle Circles/Pumps: AROM;Right;10 reps;Supine Quad Sets: AROM;Right;10 reps;Supine Heel Slides: AAROM;Right;10 reps;Supine Hip ABduction/ADduction: AAROM;Right;10  reps;Supine Straight Leg Raises: AAROM;Right;10 reps;Supine End of Session PT - End of Session Equipment Utilized During Treatment: Right knee immobilizer Activity Tolerance: Patient limited by fatigue (limited by nausea) Patient left: in chair;with call bell in reach;with family/visitor present Nurse Communication:  (pt is ready to DC) General Behavior During Session: [facility] for tasks performed (pt emotional due to feeling bad) Cognition: [facility] for tasks performed  Sonia Dawson 07/22/2011, 12:58 PM

## 2011-07-22 NOTE — Progress Notes (Signed)
Subjective: Doing very well. No major problems   Objective: Vital signs in last 24 hours: Temp:  [98.3 F (36.8 C)-99.5 F (37.5 C)] 99.2 F (37.3 C) (12/02 0554) Pulse Rate:  [64-70] 69  (12/02 0554) Resp:  [16] 16  (12/02 0554) BP: (89-120)/(60-77) 120/76 mmHg (12/02 0554) SpO2:  [93 %-95 %] 95 % (12/02 0554)  Intake/Output from previous day: 12/01 0701 - 12/02 0700 In: 1316.7 [P.O.:960; I.V.:356.7] Out: 1150 [Urine:1150] Intake/Output this shift:     Basename 07/21/11 0441 07/20/11 0526  HGB 9.2* 10.3*    Basename 07/21/11 0441 07/20/11 0526  WBC 8.1 9.3  RBC 3.16* 3.46*  HCT 27.1* 29.5*  PLT 165 177    Basename 07/20/11 0526  NA 132*  K 3.0*  CL 95*  CO2 29  BUN 16  CREATININE 1.39*  GLUCOSE 123*  CALCIUM 9.1    Basename 07/22/11 0500 07/21/11 0441  LABPT -- --  INR 2.29* 2.11*    Neurologically intact Neurovascular intact Dorsiflexion/Plantar flexion intact  Assessment/Plan: DC Today   Ketara Cavness A 07/22/2011, 8:08 AM

## 2011-07-22 NOTE — Progress Notes (Signed)
Cm spoke with pt concerning d/c planning. Sonia Dawson to provide HHPT/RN per MD order. Pt has home DME. Family at bedside to assist in home care. [name] [phone]

## 2011-07-22 NOTE — Progress Notes (Signed)
Discharged from floor via w/c, family with pt. No changes in assessment. Sonia Dawson  

## 2011-07-23 ENCOUNTER — Ambulatory Visit: Payer: BC Managed Care – PPO | Admitting: Internal Medicine

## 2011-07-23 ENCOUNTER — Telehealth: Payer: Self-pay | Admitting: *Deleted

## 2011-07-23 MED ORDER — ONDANSETRON HCL 4 MG PO TABS: 4.0000 mg | ORAL_TABLET | Freq: Every day | ORAL | Status: DC | PRN

## 2011-07-23 NOTE — Telephone Encounter (Signed)
immodium-ad

## 2011-07-23 NOTE — Telephone Encounter (Signed)
done

## 2011-07-23 NOTE — Telephone Encounter (Signed)
Pt states also needing "something for nausea" now because she is "gagging when trying to eat". Please advise.

## 2011-07-23 NOTE — Telephone Encounter (Signed)
Pt c/o nausea & diarrhea since being d/c from hospital Fri 11.28.12, could not make scheduled OV this morning; and states she cannot keep anything in & request Rx for diarrhea. Please advise.

## 2011-07-24 NOTE — Telephone Encounter (Signed)
Daughter Sonia Dawson} called back stating that "the wrong nausea medicine was sent to pharmacy" stating that the Zofran is not helping and they want Promethazine; also that the Ortho MD told them that the nausea is not related to the knee Sx.

## 2011-07-25 NOTE — Telephone Encounter (Signed)
She needs to be seen.

## 2011-07-25 NOTE — Telephone Encounter (Signed)
Pt scheduled 12.06.12

## 2011-07-26 ENCOUNTER — Other Ambulatory Visit (INDEPENDENT_AMBULATORY_CARE_PROVIDER_SITE_OTHER): Payer: BC Managed Care – PPO

## 2011-07-26 ENCOUNTER — Ambulatory Visit: Payer: BC Managed Care – PPO | Admitting: Internal Medicine

## 2011-07-26 ENCOUNTER — Ambulatory Visit (INDEPENDENT_AMBULATORY_CARE_PROVIDER_SITE_OTHER): Payer: BC Managed Care – PPO | Admitting: Internal Medicine

## 2011-07-26 DIAGNOSIS — E876 Hypokalemia: Secondary | ICD-10-CM

## 2011-07-26 DIAGNOSIS — D649 Anemia, unspecified: Secondary | ICD-10-CM

## 2011-07-26 DIAGNOSIS — J019 Acute sinusitis, unspecified: Secondary | ICD-10-CM | POA: Insufficient documentation

## 2011-07-26 DIAGNOSIS — D509 Iron deficiency anemia, unspecified: Secondary | ICD-10-CM | POA: Insufficient documentation

## 2011-07-26 LAB — VITAMIN B12: Vitamin B-12: 376 pg/mL (ref 211–911)

## 2011-07-26 LAB — CBC WITH DIFFERENTIAL/PLATELET
Basophils Absolute: 0 10*3/uL (ref 0.0–0.1)
Eosinophils Relative: 1.8 % (ref 0.0–5.0)
HCT: 30 % — ABNORMAL LOW (ref 36.0–46.0)
Hemoglobin: 10.2 g/dL — ABNORMAL LOW (ref 12.0–15.0)
Lymphocytes Relative: 26.6 % (ref 12.0–46.0)
Lymphs Abs: 1.8 10*3/uL (ref 0.7–4.0)
Monocytes Relative: 10.7 % (ref 3.0–12.0)
Platelets: 311 10*3/uL (ref 150.0–400.0)
WBC: 6.9 10*3/uL (ref 4.5–10.5)

## 2011-07-26 LAB — IBC PANEL
Iron: 42 ug/dL (ref 42–145)
Saturation Ratios: 18.6 % — ABNORMAL LOW (ref 20.0–50.0)
Transferrin: 160.9 mg/dL — ABNORMAL LOW (ref 212.0–360.0)

## 2011-07-26 LAB — BASIC METABOLIC PANEL
CO2: 29 mEq/L (ref 19–32)
Calcium: 8.6 mg/dL (ref 8.4–10.5)
Chloride: 98 mEq/L (ref 96–112)
Glucose, Bld: 81 mg/dL (ref 70–99)
Sodium: 138 mEq/L (ref 135–145)

## 2011-07-26 LAB — FOLATE: Folate: 13.8 ng/mL (ref >5.9)

## 2011-07-26 MED ORDER — AMOXICILLIN 500 MG PO CAPS: 500.0000 mg | ORAL_CAPSULE | Freq: Three times a day (TID) | ORAL | Status: AC

## 2011-07-26 MED ORDER — POTASSIUM CHLORIDE 8 MEQ PO TBCR: 8.0000 meq | EXTENDED_RELEASE_TABLET | Freq: Three times a day (TID) | ORAL | Status: DC

## 2011-07-26 NOTE — Patient Instructions (Signed)
Hypokalemia Hypokalemia means a low potassium level in the blood.Potassium is an electrolyte that helps regulate the amount of fluid in the body. It also stimulates muscle contraction and maintains a stable acid-base balance.Most of the body's potassium is inside of cells, and only a very small amount is in the blood. Because the amount in the blood is so small, minor changes can have big effects. PREPARATION FOR TEST Testing for potassium requires taking a blood sample taken by needle from a vein in the arm. The skin is cleaned thoroughly before the sample is drawn. There is no other special preparation needed. NORMAL VALUES Potassium levels below 3.5 mEq/L are abnormally low. Levels above 5.1 mEq/L are abnormally high. Ranges for normal findings may vary among different laboratories and hospitals. You should always check with your doctor after having lab work or other tests done to discuss the meaning of your test results and whether your values are considered within normal limits. MEANING OF TEST  Your caregiver will go over the test results with you and discuss the importance and meaning of your results, as well as treatment options and the need for additional tests, if necessary. A potassium level is frequently part of a routine medical exam. It is usually included as part of a whole "panel" of tests for several blood salts (such as Sodium and Chloride). It may be done as part of follow-up when a low potassium level was found in the past or other blood salts are suspected of being out of balance. A low potassium level might be suspected if you have one or more of the following:  Symptoms of weakness.   Abnormal heart rhythms.   High blood pressure and are taking medication to control this, especially water pills (diuretics).   Kidney disease that can affect your potassium level .   Diabetes requiring the use of insulin. The potassium may fall after taking insulin, especially if the  diabetes had been out of control for a while.   A condition requiring the use of cortisone-type medication or certain types of antibiotics.   Vomiting and/or diarrhea for more than a day or two.   A stomach or intestinal condition that may not permit appropriate absorption of potassium.   Fainting episodes.   Mental confusion.  OBTAINING TEST RESULTS It is your responsibility to obtain your test results. Ask the lab or department performing the test when and how you will get your results.  Please contact your caregiver directly if you have not received the results within one week. At that time, ask if there is anything different or new you should be doing in relation to the results. TREATMENT Hypokalemia can be treated with potassium supplements taken by mouth and/or adjustments in your current medications. A diet high in potassium is also helpful. Foods with high potassium content are:  Peas, lentils, lima beans, nuts, and dried fruit.   Whole grain and bran cereals and breads.   Fresh fruit, vegetables (bananas, cantaloupe, grapefruit, oranges, tomatoes, honeydew melons, potatoes).   Orange and tomato juices.   Meats. If potassium supplement has been prescribed for you today or your medications have been adjusted, see your personal caregiver in time02 for a re-check.  SEEK MEDICAL CARE IF:  There is a feeling of worsening weakness.   You experience repeated chest palpitations.   You are diabetic and having difficulty keeping your blood sugars in the normal range.   You are experiencing vomiting and/or diarrhea.   You are having  difficulty with any of your regular medications.  SEEK IMMEDIATE MEDICAL CARE IF:  You experience chest pain, shortness of breath, or episodes of dizziness.   You have been having vomiting or diarrhea for more than 2 days.   You have a fainting episode.  MAKE SURE YOU:   Understand these instructions.   Will watch your condition.   Will  get help right away if you are not doing well or get worse.  Document Released: 08/06/2005 Document Revised: 04/18/2011 Document Reviewed: 07/17/2008 [facility] Patient Information 2012 [place], [place].Sinusitis Sinuses are air pockets within the bones of your face. The growth of bacteria within a sinus leads to infection. The infection prevents the sinuses from draining. This infection is called sinusitis. SYMPTOMS  There will be different areas of pain depending on which sinuses have become infected.  The maxillary sinuses often produce pain beneath the eyes.   Frontal sinusitis may cause pain in the middle of the forehead and above the eyes.  Other problems (symptoms) include:  Toothaches.   Colored, pus-like (purulent) drainage from the nose.   Swelling, warmth, and tenderness over the sinus areas may be signs of infection.  TREATMENT  Sinusitis is most often determined by an exam.X-rays may be taken. If x-rays have been taken, make sure you obtain your results or find out how you are to obtain them. Your caregiver may give you medications (antibiotics). These are medications that will help kill the bacteria causing the infection. You may also be given a medication (decongestant) that helps to reduce sinus swelling.  HOME CARE INSTRUCTIONS   Only take over-the-counter or prescription medicines for pain, discomfort, or fever as directed by your caregiver.   Drink extra fluids. Fluids help thin the mucus so your sinuses can drain more easily.   Applying either moist heat or ice packs to the sinus areas may help relieve discomfort.   Use saline nasal sprays to help moisten your sinuses. The sprays can be found at your local drugstore.  SEEK IMMEDIATE MEDICAL CARE IF:  You have a fever.   You have increasing pain, severe headaches, or toothache.   You have nausea, vomiting, or drowsiness.   You develop unusual swelling around the face or trouble seeing.  MAKE SURE YOU:    Understand these instructions.   Will watch your condition.   Will get help right away if you are not doing well or get worse.  Document Released: 08/06/2005 Document Revised: 04/18/2011 Document Reviewed: 03/05/2007 [facility] Patient Information 2012 [place], [place].

## 2011-07-26 NOTE — Progress Notes (Signed)
Subjective:    Patient ID: Sonia Dawson, female    DOB: [birth year], 60 y.o.   MRN: [id number]  Sinusitis This is a recurrent problem. The current episode started in the past 7 days. The problem has been gradually worsening since onset. There has been no fever. Her pain is at a severity of 0/10. She is experiencing no pain. Associated symptoms include ear pain, sinus pressure, sneezing and a sore throat. Pertinent negatives include no chills, congestion, coughing, diaphoresis, headaches, hoarse voice, neck pain or shortness of breath. Past treatments include nothing.      Review of Systems  Constitutional: Negative for fever, chills, diaphoresis, activity change, appetite change, fatigue and unexpected weight change.  HENT: Positive for ear pain, sore throat, rhinorrhea, sneezing, postnasal drip and sinus pressure. Negative for hearing loss, nosebleeds, congestion, hoarse voice, facial swelling, drooling, mouth sores, trouble swallowing, neck pain, neck stiffness, dental problem, tinnitus and ear discharge.   Eyes: Negative.   Respiratory: Negative for cough, chest tightness, shortness of breath, wheezing and stridor.   Cardiovascular: Negative for chest pain, palpitations and leg swelling.  Gastrointestinal: Positive for nausea (gradually resolving s/p Right TKR). Negative for abdominal pain, diarrhea, constipation, blood in stool and anal bleeding.  Genitourinary: Negative for dysuria, urgency, frequency, hematuria, flank pain, decreased urine volume, enuresis, difficulty urinating and dyspareunia.  Musculoskeletal: Negative for myalgias, back pain, joint swelling, arthralgias and gait problem.  Skin: Negative for color change, pallor, rash and wound.  Neurological: Negative for dizziness, tremors, seizures, syncope, facial asymmetry, speech difficulty, weakness, light-headedness, numbness and headaches.  Hematological: Negative for adenopathy. Does not bruise/bleed easily.    Psychiatric/Behavioral: Negative.        Objective:   Physical Exam  Vitals reviewed. Constitutional: She is oriented to person, place, and time. She appears well-developed and well-nourished. No distress.  HENT:  Head: No trismus in the jaw.  Right Ear: Hearing, tympanic membrane, external ear and ear canal normal.  Left Ear: Hearing, tympanic membrane, external ear and ear canal normal.  Nose: Mucosal edema and rhinorrhea present. No nose lacerations, sinus tenderness, nasal deformity, septal deviation or nasal septal hematoma. No epistaxis.  No foreign bodies. Right sinus exhibits maxillary sinus tenderness. Right sinus exhibits no frontal sinus tenderness. Left sinus exhibits maxillary sinus tenderness. Left sinus exhibits no frontal sinus tenderness.  Mouth/Throat: Oropharynx is clear and moist and mucous membranes are normal. Mucous membranes are not pale, not dry and not cyanotic. No uvula swelling. No oropharyngeal exudate, posterior oropharyngeal edema, posterior oropharyngeal erythema or tonsillar abscesses.  Eyes: Conjunctivae are normal. Right eye exhibits no discharge. Left eye exhibits no discharge. No scleral icterus.  Neck: Normal range of motion. Neck supple. No JVD present. No tracheal deviation present. No thyromegaly present.  Cardiovascular: Normal rate, regular rhythm, normal heart sounds and intact distal pulses.  Exam reveals no gallop and no friction rub.   No murmur heard. Pulmonary/Chest: Effort normal and breath sounds normal. No stridor. No respiratory distress. She has no wheezes. She has no rales. She exhibits no tenderness.  Abdominal: Soft. Bowel sounds are normal. She exhibits no distension and no mass. There is no tenderness. There is no rebound and no guarding.  Musculoskeletal: Normal range of motion. She exhibits no edema and no tenderness.  Lymphadenopathy:    She has no cervical adenopathy.  Neurological: She is oriented to person, place, and time.   Skin: Skin is warm and dry. No rash noted. She is not diaphoretic. No erythema. No pallor.  Psychiatric: She has a normal mood and affect. Her behavior is normal. Judgment and thought content normal.      Lab Results  Component Value Date   WBC 8.1 07/21/2011   HGB 9.2* 07/21/2011   HCT 27.1* 07/21/2011   PLT 165 07/21/2011   GLUCOSE 123* 07/20/2011   ALT 28 07/11/2011   AST 26 07/11/2011   NA 132* 07/20/2011   K 3.0* 07/20/2011   CL 95* 07/20/2011   CREATININE 1.39* 07/20/2011   BUN 16 07/20/2011   CO2 29 07/20/2011   TSH 0.558 01/29/2011   INR 2.29* 07/22/2011   HGBA1C 6.0 12/31/2008      Assessment & Plan:

## 2011-07-27 ENCOUNTER — Encounter: Payer: Self-pay | Admitting: Internal Medicine

## 2011-08-05 ENCOUNTER — Encounter: Payer: Self-pay | Admitting: Internal Medicine

## 2011-08-05 NOTE — Assessment & Plan Note (Signed)
I will recheck her CBC today and will look at her vitamin levels as well 

## 2011-08-05 NOTE — Assessment & Plan Note (Signed)
I will check her K+ and Mg++ levels today 

## 2011-08-05 NOTE — Assessment & Plan Note (Signed)
Start amoxil for the infection 

## 2011-08-16 ENCOUNTER — Ambulatory Visit: Payer: BC Managed Care – PPO | Admitting: Internal Medicine

## 2011-08-16 ENCOUNTER — Ambulatory Visit (INDEPENDENT_AMBULATORY_CARE_PROVIDER_SITE_OTHER): Payer: BC Managed Care – PPO | Admitting: Internal Medicine

## 2011-08-16 ENCOUNTER — Other Ambulatory Visit (INDEPENDENT_AMBULATORY_CARE_PROVIDER_SITE_OTHER): Payer: BC Managed Care – PPO

## 2011-08-16 ENCOUNTER — Encounter: Payer: Self-pay | Admitting: Internal Medicine

## 2011-08-16 DIAGNOSIS — I10 Essential (primary) hypertension: Secondary | ICD-10-CM

## 2011-08-16 DIAGNOSIS — E876 Hypokalemia: Secondary | ICD-10-CM

## 2011-08-16 DIAGNOSIS — D649 Anemia, unspecified: Secondary | ICD-10-CM

## 2011-08-16 DIAGNOSIS — R7309 Other abnormal glucose: Secondary | ICD-10-CM

## 2011-08-16 DIAGNOSIS — E039 Hypothyroidism, unspecified: Secondary | ICD-10-CM

## 2011-08-16 LAB — CBC WITH DIFFERENTIAL/PLATELET
Basophils Absolute: 0 10*3/uL (ref 0.0–0.1)
Eosinophils Relative: 1.9 % (ref 0.0–5.0)
HCT: 36 % (ref 36.0–46.0)
Lymphocytes Relative: 27.4 % (ref 12.0–46.0)
Lymphs Abs: 1.6 10*3/uL (ref 0.7–4.0)
Monocytes Relative: 7.1 % (ref 3.0–12.0)
Neutrophils Relative %: 63.1 % (ref 43.0–77.0)
Platelets: 261 10*3/uL (ref 150.0–400.0)
RDW: 18.3 % — ABNORMAL HIGH (ref 11.5–14.6)
WBC: 5.7 10*3/uL (ref 4.5–10.5)

## 2011-08-16 LAB — COMPREHENSIVE METABOLIC PANEL
ALT: 12 U/L (ref 0–35)
Alkaline Phosphatase: 69 U/L (ref 39–117)
Sodium: 139 mEq/L (ref 135–145)
Total Bilirubin: 0.9 mg/dL (ref 0.3–1.2)
Total Protein: 7.5 g/dL (ref 6.0–8.3)

## 2011-08-16 LAB — FOLATE: Folate: 11.7 ng/mL (ref >5.9)

## 2011-08-16 LAB — HEMOGLOBIN A1C: Hgb A1c MFr Bld: 4.9 % (ref 4.6–6.5)

## 2011-08-16 NOTE — Assessment & Plan Note (Signed)
She is due for an a1c check today 

## 2011-08-16 NOTE — Assessment & Plan Note (Signed)
Her BP is well controlled 

## 2011-08-16 NOTE — Patient Instructions (Signed)
Hypothyroidism The thyroid is a large gland located in the lower front of your neck. The thyroid gland helps control metabolism. Metabolism is how your body handles food. It controls metabolism with the hormone thyroxine. When this gland is underactive (hypothyroid), it produces too little hormone.  CAUSES These include:   Absence or destruction of thyroid tissue.   Goiter due to iodine deficiency.   Goiter due to medications.   Congenital defects (since birth).   Problems with the pituitary. This causes a lack of TSH (thyroid stimulating hormone). This hormone tells the thyroid to turn out more hormone.  SYMPTOMS  Lethargy (feeling as though you have no energy)   Cold intolerance   Weight gain (in spite of normal food intake)   Dry skin   Coarse hair   Menstrual irregularity (if severe, may lead to infertility)   Slowing of thought processes  Cardiac problems are also caused by insufficient amounts of thyroid hormone. Hypothyroidism in the newborn is cretinism, and is an extreme form. It is important that this form be treated adequately and immediately or it will lead rapidly to retarded physical and mental development. DIAGNOSIS  To prove hypothyroidism, your caregiver may do blood tests and ultrasound tests. Sometimes the signs are hidden. It may be necessary for your caregiver to watch this illness with blood tests either before or after diagnosis and treatment. TREATMENT  Low levels of thyroid hormone are increased by using synthetic thyroid hormone. This is a safe, effective treatment. It usually takes about four weeks to gain the full effects of the medication. After you have the full effect of the medication, it will generally take another four weeks for problems to leave. Your caregiver may start you on low doses. If you have had heart problems the dose may be gradually increased. It is generally not an emergency to get rapidly to normal. HOME CARE INSTRUCTIONS   Take  your medications as your caregiver suggests. Let your caregiver know of any medications you are taking or start taking. Your caregiver will help you with dosage schedules.   As your condition improves, your dosage needs may increase. It will be necessary to have continuing blood tests as suggested by your caregiver.   Report all suspected medication side effects to your caregiver.  SEEK MEDICAL CARE IF: Seek medical care if you develop:  Sweating.   Tremulousness (tremors).   Anxiety.   Rapid weight loss.   Heat intolerance.   Emotional swings.   Diarrhea.   Weakness.  SEEK IMMEDIATE MEDICAL CARE IF:  You develop chest pain, an irregular heart beat (palpitations), or a rapid heart beat. MAKE SURE YOU:   Understand these instructions.   Will watch your condition.   Will get help right away if you are not doing well or get worse.  Document Released: 08/06/2005 Document Revised: 04/18/2011 Document Reviewed: 03/26/2008 [facility] Patient Information 2012 [place], [place].Anemia, Nonspecific Your exam and blood tests show you are anemic. This means your blood (hemoglobin) level is low. Normal hemoglobin values are 12 to 15 g/dL for females and 14 to 17 g/dL for males. Make a note of your hemoglobin level today. The hematocrit percent is also used to measure anemia. A normal hematocrit is 38% to 46% in females and 42% to 49% in males. Make a note of your hematocrit level today. CAUSES  Anemia can be due to many different causes.  Excessive bleeding from periods (in women).   Intestinal bleeding.   Poor nutrition.   Kidney,  thyroid, liver, and bone marrow diseases.  SYMPTOMS  Anemia can come on suddenly (acute). It can also come on slowly. Symptoms can include:  Minor weakness.   Dizziness.   Palpitations.   Shortness of breath.  Symptoms may be absent until half your hemoglobin is missing if it comes on slowly. Anemia due to acute blood loss from an injury or internal  bleeding may require blood transfusion if the loss is severe. Hospital care is needed if you are anemic and there is significant continual blood loss. TREATMENT   Stool tests for blood (Hemoccult) and additional lab tests are often needed. This determines the best treatment.   Further checking on your condition and your response to treatment is very important. It often takes many weeks to correct anemia.  Depending on the cause, treatment can include:  Supplements of iron.   Vitamins B12 and folic acid.   Hormone medicines.If your anemia is due to bleeding, finding the cause of the blood loss is very important. This will help avoid further problems.  SEEK IMMEDIATE MEDICAL CARE IF:   You develop fainting, extreme weakness, shortness of breath, or chest pain.   You develop heavy vaginal bleeding.   You develop bloody or black, tarry stools or vomit up blood.   You develop a high fever, rash, repeated vomiting, or dehydration.  Document Released: 09/13/2004 Document Revised: 04/18/2011 Document Reviewed: 06/21/2009 [facility] Patient Information 2012 [place], [place].

## 2011-08-16 NOTE — Assessment & Plan Note (Signed)
I will check her TSH today 

## 2011-08-16 NOTE — Assessment & Plan Note (Signed)
I will recheck her K+ level today 

## 2011-08-16 NOTE — Assessment & Plan Note (Signed)
I will recheck her CBC today and will check her vitamin levels as well

## 2011-08-16 NOTE — Progress Notes (Signed)
Subjective:    Patient ID: Sonia Dawson, female    DOB: 1950/11/29, 60 y.o.   MRN: [id number]  Anemia Presents for follow-up visit. Symptoms include malaise/fatigue. There has been no abdominal pain, anorexia, bruising/bleeding easily, confusion, fever, leg swelling, light-headedness, pallor, palpitations, paresthesias, pica or weight loss. Signs of blood loss that are not present include hematemesis, hematochezia, melena and vaginal bleeding.  Thyroid Problem Presents for follow-up visit. Symptoms include depressed mood. Patient reports no anxiety, cold intolerance, constipation, diaphoresis, diarrhea, dry skin, fatigue, hair loss, heat intolerance, hoarse voice, leg swelling, menstrual problem, nail problem, palpitations, tremors, visual change, weight gain or weight loss. The symptoms have been stable.      Review of Systems  Constitutional: Positive for malaise/fatigue. Negative for fever, chills, weight loss, weight gain, diaphoresis, activity change, appetite change, fatigue and unexpected weight change.  HENT: Negative.  Negative for hoarse voice.   Eyes: Negative.   Respiratory: Negative for cough, chest tightness, shortness of breath, wheezing and stridor.   Cardiovascular: Negative for chest pain, palpitations and leg swelling.  Gastrointestinal: Negative for nausea, vomiting, abdominal pain, diarrhea, constipation, blood in stool, melena, hematochezia, abdominal distention, anorexia and hematemesis.  Genitourinary: Negative.  Negative for vaginal bleeding and menstrual problem.  Musculoskeletal: Negative for myalgias, back pain, joint swelling, arthralgias and gait problem.  Skin: Negative for color change, pallor, rash and wound.  Neurological: Negative for dizziness, tremors, seizures, syncope, facial asymmetry, speech difficulty, weakness, light-headedness, numbness, headaches and paresthesias.  Hematological: Negative for cold intolerance, heat intolerance and adenopathy. Does  not bruise/bleed easily.  Psychiatric/Behavioral: Positive for dysphoric mood. Negative for suicidal ideas, hallucinations, behavioral problems, confusion, sleep disturbance, self-injury, decreased concentration and agitation. The patient is not nervous/anxious and is not hyperactive.        Objective:   Physical Exam  Vitals reviewed. Constitutional: She is oriented to person, place, and time. She appears well-developed and well-nourished. No distress.  HENT:  Head: Normocephalic and atraumatic.  Mouth/Throat: Oropharynx is clear and moist. No oropharyngeal exudate.  Eyes: Conjunctivae are normal. Right eye exhibits no discharge. Left eye exhibits no discharge. No scleral icterus.  Neck: Normal range of motion. Neck supple. No JVD present. No tracheal deviation present. No thyromegaly present.  Cardiovascular: Normal rate, regular rhythm, normal heart sounds and intact distal pulses.  Exam reveals no gallop and no friction rub.   No murmur heard. Pulmonary/Chest: Effort normal and breath sounds normal. No stridor. No respiratory distress. She has no wheezes. She has no rales. She exhibits no tenderness.  Abdominal: Soft. Bowel sounds are normal. She exhibits no distension and no mass. There is no tenderness. There is no rebound and no guarding.  Musculoskeletal: Normal range of motion. She exhibits no edema and no tenderness.       Right knee: She exhibits swelling. She exhibits normal range of motion, no effusion, no ecchymosis, no erythema and no bony tenderness. no tenderness found.       Legs: Lymphadenopathy:    She has no cervical adenopathy.  Neurological: She is oriented to person, place, and time.  Skin: Skin is warm and dry. No rash noted. She is not diaphoretic. No erythema. No pallor.  Psychiatric: She has a normal mood and affect. Her behavior is normal. Judgment and thought content normal.      Lab Results  Component Value Date   WBC 6.9 07/26/2011   HGB 10.2* 07/26/2011    HCT 30.0* 07/26/2011   PLT 311.0 07/26/2011   GLUCOSE  81 07/26/2011   ALT 28 07/11/2011   AST 26 07/11/2011   NA 138 07/26/2011   K 3.4* 07/26/2011   CL 98 07/26/2011   CREATININE 1.2 07/26/2011   BUN 7 07/26/2011   CO2 29 07/26/2011   TSH 0.558 01/29/2011   INR 2.29* 07/22/2011   HGBA1C 6.0 12/31/2008      Assessment & Plan:

## 2011-08-17 LAB — IBC PANEL
Saturation Ratios: 15.4 % — ABNORMAL LOW (ref 20.0–50.0)
Transferrin: 217.4 mg/dL (ref 212.0–360.0)

## 2011-09-01 ENCOUNTER — Other Ambulatory Visit: Payer: Self-pay | Admitting: Internal Medicine

## 2011-09-02 NOTE — Discharge Summary (Signed)
Physician Discharge Summary  Patient ID: Sonia Dawson MRN: [id number] DOB/AGE: 10/21/1950 61 y.o.  Admit date: 07/18/2011 Discharge date: 07/22/2011 Admission Diagnoses:Osteoarthritis of Knee,Right  Discharge Diagnoses:  Active Problems:  * No active hospital problems. *    Discharged Condition: good  Hospital Course: Physical therapy and coumadin therapy.  Consults:none}  Significant Diagnostic Studies: Post-Op Xrays of total knee.}  Treatments: Physical Therapy  Discharge Exam: Blood pressure 120/76, pulse 69, temperature 99.2 F (37.3 C), temperature source Oral, resp. rate 16, height 5\' 6"  (1.676 m), weight 120.657 kg (266 lb), SpO2 95.00%. Wound looks fine and good circulation in extremity.  Disposition: Home-Health Care Svc  Discharge Orders    Future Appointments: Provider: Department: Dept Phone: Center:   09/11/2011 8:00 AM Etta Grandchild, MD [facility] [phone] [facility]   09/14/2011 10:00 AM Etta Grandchild, MD [facility] [phone] [facility]     Future Orders Please Complete By Expires   Diet general      Call MD / Call 911      Comments:   If you experience chest pain or shortness of breath, CALL 911 and be transported to the hospital emergency room.  If you develope a fever above 101 F, pus (white drainage) or increased drainage or redness at the wound, or calf pain, call your surgeon's office.   Increase activity slowly as tolerated      Weight Bearing as taught in Physical Therapy      Comments:   Use a walker or crutches as instructed.   Discharge instructions      Comments:   Walk with your walker. Weight bearing as instructed. Home Health Agency will follow you at home for your therapy and to manage your Coumadin. Change your dressing daily. Shower only, no tub bath. Call if any temperatures greater than 101 or any wound complications: [phone] during the day and ask for Dr. Jeannetta Ellis nurse, [facility].   Driving restrictions      Comments:   No  driving for 4 weeks     Medication List  As of 09/02/2011  3:43 PM   STOP taking these medications         BAYER ASPIRIN EC LOW DOSE 81 MG EC tablet      celecoxib 200 MG capsule         TAKE these medications         allopurinol 300 MG tablet   Commonly known as: ZYLOPRIM   Take 300 mg by mouth daily as needed. GOUT        ALPRAZolam 0.5 MG tablet   Commonly known as: XANAX   Take 1 tablet (0.5 mg total) by mouth 3 (three) times daily as needed for sleep or anxiety.      budesonide-formoterol 160-4.5 MCG/ACT inhaler   Commonly known as: SYMBICORT   Inhale 2 puffs into the lungs 2 (two) times daily as needed. WHEEZING        buPROPion 300 MG 24 hr tablet   Commonly known as: WELLBUTRIN XL      clonazePAM 0.5 MG tablet   Commonly known as: KLONOPIN   Take 1 tablet (0.5 mg total) by mouth 2 (two) times daily as needed for anxiety. 2 bid      meclizine 12.5 MG tablet   Commonly known as: ANTIVERT   Take 12.5 mg by mouth 4 (four) times daily as needed. Take 1-2 by mouth four times daily ad needed for dizziness      metoprolol succinate  50 MG 24 hr tablet   Commonly known as: TOPROL-XL   Take 50 mg by mouth 3 (three) times daily. Verifed with patient that she takes sustained release product three times a day, presciption instructions/pharmacy are once daily. Chilton Si, Terri L 7:41 PM        sertraline 100 MG tablet   Commonly known as: ZOLOFT      SYNTHROID 150 MCG tablet   Generic drug: levothyroxine             Signed: Fadel Clason A 09/02/2011, 3:43 PM

## 2011-09-11 ENCOUNTER — Ambulatory Visit (INDEPENDENT_AMBULATORY_CARE_PROVIDER_SITE_OTHER): Payer: BC Managed Care – PPO | Admitting: Internal Medicine

## 2011-09-11 ENCOUNTER — Encounter: Payer: Self-pay | Admitting: Internal Medicine

## 2011-09-11 DIAGNOSIS — I10 Essential (primary) hypertension: Secondary | ICD-10-CM

## 2011-09-11 DIAGNOSIS — D649 Anemia, unspecified: Secondary | ICD-10-CM

## 2011-09-11 DIAGNOSIS — F411 Generalized anxiety disorder: Secondary | ICD-10-CM

## 2011-09-11 DIAGNOSIS — F419 Anxiety disorder, unspecified: Secondary | ICD-10-CM

## 2011-09-11 DIAGNOSIS — E039 Hypothyroidism, unspecified: Secondary | ICD-10-CM

## 2011-09-11 MED ORDER — METOPROLOL SUCCINATE ER 50 MG PO TB24: 50.0000 mg | ORAL_TABLET | Freq: Three times a day (TID) | ORAL | Status: DC

## 2011-09-11 MED ORDER — CLONAZEPAM 0.5 MG PO TABS: 1.0000 mg | ORAL_TABLET | Freq: Two times a day (BID) | ORAL | Status: DC | PRN

## 2011-09-11 NOTE — Progress Notes (Signed)
Subjective:    Patient ID: Sonia Dawson, female    DOB: Mar 19, 1951, 61 y.o.   MRN: [id number]  Hypertension This is a chronic problem. The current episode started more than 1 year ago. The problem has been gradually improving since onset. The problem is controlled. Associated symptoms include anxiety. Pertinent negatives include no blurred vision, chest pain, headaches, malaise/fatigue, neck pain, orthopnea, palpitations, peripheral edema, PND, shortness of breath or sweats. There are no associated agents to hypertension. Past treatments include beta blockers. The current treatment provides significant improvement. Compliance problems include exercise and diet.       Review of Systems  Constitutional: Negative for fever, chills, malaise/fatigue, diaphoresis, activity change, appetite change, fatigue and unexpected weight change.  HENT: Negative.  Negative for neck pain.   Eyes: Negative.  Negative for blurred vision.  Respiratory: Negative for cough, chest tightness, shortness of breath, wheezing and stridor.   Cardiovascular: Negative for chest pain, palpitations, orthopnea, leg swelling and PND.  Gastrointestinal: Negative for nausea, vomiting, abdominal pain, diarrhea, constipation and blood in stool.  Genitourinary: Negative.   Musculoskeletal: Negative for myalgias, back pain, joint swelling, arthralgias and gait problem.  Skin: Negative for color change, pallor, rash and wound.  Neurological: Negative for dizziness, tremors, seizures, syncope, facial asymmetry, speech difficulty, weakness, light-headedness, numbness and headaches.  Hematological: Negative for adenopathy. Does not bruise/bleed easily.  Psychiatric/Behavioral: Positive for dysphoric mood. Negative for suicidal ideas, hallucinations, behavioral problems, confusion, sleep disturbance, self-injury, decreased concentration and agitation. The patient is nervous/anxious. The patient is not hyperactive.        Objective:   Physical Exam  Vitals reviewed. Constitutional: She is oriented to person, place, and time. She appears well-developed and well-nourished. No distress.  HENT:  Head: Normocephalic and atraumatic.  Mouth/Throat: Oropharynx is clear and moist. No oropharyngeal exudate.  Eyes: Conjunctivae are normal. Right eye exhibits no discharge. Left eye exhibits no discharge. No scleral icterus.  Neck: Normal range of motion. Neck supple. No JVD present. No tracheal deviation present. No thyromegaly present.  Cardiovascular: Normal rate, regular rhythm, normal heart sounds and intact distal pulses.  Exam reveals no gallop and no friction rub.   No murmur heard. Pulmonary/Chest: Effort normal and breath sounds normal. No stridor. No respiratory distress. She has no wheezes. She has no rales. She exhibits no tenderness.  Abdominal: Soft. Bowel sounds are normal. She exhibits no distension and no mass. There is no tenderness. There is no rebound and no guarding.  Musculoskeletal: Normal range of motion. She exhibits no edema and no tenderness.  Lymphadenopathy:    She has no cervical adenopathy.  Neurological: She is oriented to person, place, and time.  Skin: Skin is warm and dry. No rash noted. She is not diaphoretic. No erythema. No pallor.  Psychiatric: Her speech is normal and behavior is normal. Judgment and thought content normal. Her mood appears anxious. Her affect is not angry, not blunt, not labile and not inappropriate. Cognition and memory are normal. She exhibits a depressed mood (tearful).      Lab Results  Component Value Date   WBC 5.7 08/16/2011   HGB 12.1 08/16/2011   HCT 36.0 08/16/2011   PLT 261.0 08/16/2011   GLUCOSE 104* 08/16/2011   ALT 12 08/16/2011   AST 26 08/16/2011   NA 139 08/16/2011   K 4.3 08/16/2011   CL 104 08/16/2011   CREATININE 1.1 08/16/2011   BUN 7 08/16/2011   CO2 27 08/16/2011   TSH 2.49 08/16/2011  INR 2.29* 07/22/2011   HGBA1C 4.9 08/16/2011        Assessment & Plan:

## 2011-09-11 NOTE — Assessment & Plan Note (Signed)
Recent TSH was normal 

## 2011-09-11 NOTE — Patient Instructions (Signed)

## 2011-09-11 NOTE — Assessment & Plan Note (Signed)
Her BP is well controlled 

## 2011-09-11 NOTE — Assessment & Plan Note (Signed)
improved

## 2011-09-11 NOTE — Assessment & Plan Note (Signed)
Continue current meds, I have asked her to start psychotherapy

## 2011-09-14 ENCOUNTER — Ambulatory Visit: Payer: BC Managed Care – PPO | Admitting: Internal Medicine

## 2011-10-01 ENCOUNTER — Other Ambulatory Visit: Payer: Self-pay | Admitting: Internal Medicine

## 2011-10-15 ENCOUNTER — Other Ambulatory Visit: Payer: Self-pay | Admitting: Internal Medicine

## 2011-10-16 NOTE — Telephone Encounter (Signed)
Done

## 2011-11-23 ENCOUNTER — Encounter (HOSPITAL_COMMUNITY): Payer: Self-pay

## 2011-11-23 ENCOUNTER — Encounter (HOSPITAL_COMMUNITY): Payer: Self-pay | Admitting: Pharmacy Technician

## 2011-11-23 ENCOUNTER — Encounter (HOSPITAL_COMMUNITY)
Admission: RE | Admit: 2011-11-23 | Discharge: 2011-11-23 | Disposition: A | Discharge status: Home / Self Care | Payer: BC Managed Care – PPO | Source: Ambulatory Visit | Attending: Orthopedic Surgery | Admitting: Orthopedic Surgery

## 2011-11-23 HISTORY — DX: Pain, unspecified: R52

## 2011-11-23 HISTORY — DX: Sleep apnea, unspecified: G47.30

## 2011-11-23 HISTORY — DX: Peripheral vascular disease, unspecified: I73.9

## 2011-11-23 LAB — CBC
HCT: 36.3 % (ref 36.0–46.0)
Hemoglobin: 11.7 g/dL — ABNORMAL LOW (ref 12.0–15.0)
MCH: 26.4 pg (ref 26.0–34.0)
MCHC: 32.2 g/dL (ref 30.0–36.0)
MCV: 81.9 fL (ref 78.0–100.0)
Platelets: 221 10*3/uL (ref 150–400)
RBC: 4.43 MIL/uL (ref 3.87–5.11)
RDW: 14.8 % (ref 11.5–15.5)
WBC: 5.5 10*3/uL (ref 4.0–10.5)

## 2011-11-23 LAB — COMPREHENSIVE METABOLIC PANEL
ALT: 16 U/L (ref 0–35)
AST: 31 U/L (ref 0–37)
Albumin: 4 g/dL (ref 3.5–5.2)
Alkaline Phosphatase: 77 U/L (ref 39–117)
BUN: 8 mg/dL (ref 6–23)
CO2: 30 mEq/L (ref 19–32)
Calcium: 10 mg/dL (ref 8.4–10.5)
Chloride: 103 mEq/L (ref 96–112)
Creatinine, Ser: 1.05 mg/dL (ref 0.50–1.10)
GFR calc Af Amer: 66 mL/min — ABNORMAL LOW (ref >90)
GFR calc non Af Amer: 57 mL/min — ABNORMAL LOW (ref >90)
Glucose, Bld: 87 mg/dL (ref 70–99)
Potassium: 4.3 mEq/L (ref 3.5–5.1)
Sodium: 141 mEq/L (ref 135–145)
Total Bilirubin: 0.5 mg/dL (ref 0.3–1.2)
Total Protein: 7.9 g/dL (ref 6.0–8.3)

## 2011-11-23 LAB — DIFFERENTIAL
Basophils Absolute: 0 10*3/uL (ref 0.0–0.1)
Basophils Relative: 1 % (ref 0–1)
Eosinophils Absolute: 0.2 10*3/uL (ref 0.0–0.7)
Eosinophils Relative: 3 % (ref 0–5)
Lymphocytes Relative: 43 % (ref 12–46)
Lymphs Abs: 2.4 10*3/uL (ref 0.7–4.0)
Monocytes Absolute: 0.5 10*3/uL (ref 0.1–1.0)
Monocytes Relative: 8 % (ref 3–12)
Neutro Abs: 2.5 10*3/uL (ref 1.7–7.7)
Neutrophils Relative %: 46 % (ref 43–77)

## 2011-11-23 LAB — URINALYSIS, ROUTINE W REFLEX MICROSCOPIC
Bilirubin Urine: NEGATIVE
Glucose, UA: NEGATIVE mg/dL
Hgb urine dipstick: NEGATIVE
Ketones, ur: NEGATIVE mg/dL
Leukocytes, UA: NEGATIVE
Nitrite: NEGATIVE
Protein, ur: NEGATIVE mg/dL
Specific Gravity, Urine: 1.015 (ref 1.005–1.030)
Urobilinogen, UA: 0.2 mg/dL (ref 0.0–1.0)
pH: 6 (ref 5.0–8.0)

## 2011-11-23 LAB — PROTIME-INR
INR: 0.97 (ref 0.00–1.49)
Prothrombin Time: 13.1 seconds (ref 11.6–15.2)

## 2011-11-23 LAB — APTT: aPTT: 36 seconds (ref 24–37)

## 2011-11-23 NOTE — Pre-Procedure Instructions (Signed)
PT HAS NOTE OF MEDICAL CLEARANCE ON CHART FROM DR. THOMAS JONES.

## 2011-11-23 NOTE — Pre-Procedure Instructions (Signed)
PT HAS EKG AND CXR REPORTS FROM [facility] DONE 07/11/11--IN EPIC AND COPIES ON PT'S CHART. CBC, CMET, PT, PTT, UA WERE DONE TODAY PREOP AND T/S WILL BE DRAWN THE DAY OF PT'S SURGERY.

## 2011-11-23 NOTE — Patient Instructions (Signed)
YOUR SURGERY IS SCHEDULED ON:  WED 4 /10  AT 8:30 AM  REPORT TO [facility] AT  6:30AM      PHONE # FOR SHORT STAY IS [phone]  DO NOT EAT OR DRINK ANYTHING AFTER MIDNIGHT THE NIGHT BEFORE YOUR SURGERY.  YOU MAY BRUSH YOUR TEETH, RINSE OUT YOUR MOUTH--BUT NO WATER, NO FOOD, NO CHEWING GUM, NO MINTS, NO CANDIES, NO CHEWING TOBACCO.  PLEASE TAKE THE FOLLOWING MEDICATIONS THE AM OF YOUR SURGERY WITH A FEW SIPS OF WATER:  METOPROLOL, WELLBUTRIN, KLONOPIN, SYNTHROID, ZOLOFT    IF YOU USE INHALERS--USE YOUR INHALERS THE AM OF YOUR SURGERY AND BRING INHALERS TO THE HOSPITAL -TAKE TO SURGERY.    IF YOU ARE DIABETIC:  DO NOT TAKE ANY DIABETIC MEDICATIONS THE AM OF YOUR SURGERY.  IF YOU TAKE INSULIN IN THE EVENINGS--PLEASE ONLY TAKE 1/2 NORMAL EVENING DOSE THE NIGHT BEFORE YOUR SURGERY.  NO INSULIN THE AM OF YOUR SURGERY.  IF YOU HAVE SLEEP APNEA AND USE CPAP OR BIPAP--PLEASE BRING THE MASK --NOT THE MACHINE-NOT THE TUBING   -JUST THE MASK. DO NOT BRING VALUABLES, MONEY, CREDIT CARDS.  CONTACT LENS, DENTURES / PARTIALS, GLASSES SHOULD NOT BE WORN TO SURGERY AND IN MOST CASES-HEARING AIDS WILL NEED TO BE REMOVED.  BRING YOUR GLASSES CASE, ANY EQUIPMENT NEEDED FOR YOUR CONTACT LENS. FOR PATIENTS ADMITTED TO THE HOSPITAL--CHECK OUT TIME THE DAY OF DISCHARGE IS 11:00 AM.  ALL INPATIENT ROOMS ARE PRIVATE - WITH BATHROOM, TELEPHONE, TELEVISION AND WIFI INTERNET. IF YOU ARE BEING DISCHARGED THE SAME DAY OF YOUR SURGERY--YOU CAN NOT DRIVE YOURSELF HOME--AND SHOULD NOT GO HOME ALONE BY TAXI OR BUS.  NO DRIVING OR OPERATING MACHINERY FOR 24 HOURS FOLLOWING ANESTHESIA / PAIN MEDICATIONS.                            SPECIAL INSTRUCTIONS:  CHLORHEXIDINE SOAP SHOWER (other brand names are Betasept and Hibiclens ) PLEASE SHOWER WITH CHLORHEXIDINE THE NIGHT BEFORE YOUR SURGERY AND THE AM OF YOUR SURGERY. DO NOT USE CHLORHEXIDINE ON YOUR FACE OR PRIVATE AREAS--YOU MAY USE YOUR NORMAL SOAP THOSE  AREAS AND YOUR NORMAL SHAMPOO.  WOMEN SHOULD AVOID SHAVING UNDER ARMS AND SHAVING LEGS 48 HOURS BEFORE USING CHLORHEXIDINE TO AVOID SKIN IRRITATION.  DO NOT USE IF ALLERGIC TO CHLORHEXIDINE.  PLEASE READ OVER ANY  FACT SHEETS THAT YOU WERE GIVEN: MRSA INFORMATION, BLOOD TRANSFUSION INFORMATION, INCENTIVE SPIROMETER INFORMATION.

## 2011-11-27 NOTE — H&P (Signed)
Sonia Dawson DOB: 03-17-1951   Chief Complaint: left knee pain  History of Present Illness The patient is a 61 year old female who comes in today for a preoperative History and Physical. The patient is scheduled for a left total knee arthroplasty to be performed by Dr. Georges Lynch. Darrelyn Hillock, MD at [facility] on Wednesday November 28, 2011 . The patient presented to Dr. Jeannetta Ellis office with bilateral knee pain. She had a left knee scope in 2010 and a right knee arthroplasty in late 2012. The right knee is currently doing very well. She states that the left knee is painful, mostly with weightbearing and hindering ability to do things she needs and wants to do. She has bone on bone arthritis in the left knee and has failed cortisone injections. Dr. Darrelyn Hillock stated that due to the extent of her arthritis viscosupplementation would not provide relief either. Most predictable means for decreased pain and increased function is a left total knee arthroplasty. Risks and benefits of surgery discussed. PCP: Dr. 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  EMYCIN.    Social History Alcohol use. Moderate alcohol use. Tobacco use. Current every day smoker.   Medication History Norco (5-325MG  Tablet, 1 Oral ONE TAB PO Q 6H, Taken starting 10/04/2011) Active.  TraMADol HCl (50MG  Tablet, Oral) Active. Hydrocodone-Acetaminophen (5-500MG  Tablet, Oral) Active. Synthroid ( Tablet, Oral) Active. Wellbutrin XL (300MG  Tablet ER 24HR, Oral) Active. Zoloft (100MG  Tablet, Oral) Active. Warfarin Sodium (5MG  Tablet, Oral) Active. Pravastatin Sodium (40MG  Tablet, Oral) Active. ClonazePAM (0.5MG  Tablet, Oral) Active. Aspirin EC ( Oral) Specific dose unknown - Active. Metoprolol Succinate (50MG  Tablet ER  24HR, Oral three times daily) Active. Oxycodone-Acetaminophen (10-650MG  Tablet, Oral) Active. ALPRAZolam (0.5MG  Tablet, Oral) Active. (prn)   Past Surgical History Tubal Ligation Deviated Nose Septum Surgery Arthroscopic Knee Surgery - Left. 2010 Total Knee Replacement - Right. 2012 Tonsillectomy Carpal Tunnel Surgery - Left    Review of Systems General:Not Present- Chills, Fever, Night Sweats, Fatigue, Weight Gain, Weight Loss and Memory Loss. Skin:Not Present- Hives, Itching, Rash, Eczema and Lesions. HEENT:Not Present- Tinnitus, Headache, Double Vision, Visual Loss, Hearing Loss and Dentures. Respiratory:Present- Shortness of breath with exertion. Not Present- Shortness of breath at rest, Allergies, Coughing up blood and Chronic Cough. Cardiovascular:Not Present- Chest Pain, Racing/skipping heartbeats, Difficulty Breathing Lying Down, Murmur, Swelling and Palpitations. Gastrointestinal:Not Present- Bloody Stool, Heartburn, Abdominal Pain, Vomiting, Nausea, Constipation, Diarrhea, Difficulty Swallowing, Jaundice and Loss of appetitie. Female Genitourinary:Present- Incontinence. Not Present- Blood in Urine, Urinary frequency, Weak urinary stream, Discharge, Flank Pain, Painful Urination, Urgency, Urinary Retention and Urinating at Night. Musculoskeletal:Present- Muscle Weakness, Joint Swelling, Joint Pain and Morning Stiffness. Not Present- Muscle Pain, Back Pain and Spasms. Neurological:Not Present- Tremor, Dizziness, Blackout spells, Paralysis, Difficulty with balance and Weakness. Psychiatric:Not Present- Insomnia.   Vitals Weight: 260 lb Height: 66 in Body Surface Area: 2.34 m Body Mass Index: 41.96 kg/m Pulse: 65 (Regular) Resp.: 16 (Unlabored) BP: 138/81 (Sitting, Left Arm, Standard)    Physical Exam General Mental Status - Alert, cooperative and good historian. General Appearance- pleasant. Not in acute distress. Orientation-  Oriented X3. Build & Nutrition- Obese and Well developed. Head and Neck Head- normocephalic, atraumatic . Neck Global Assessment- supple. no bruit auscultated on the right and no bruit auscultated on the left. Eye Pupil- Bilateral- PERRLA. Motion- Bilateral- EOMI. Chest and Lung Exam Auscultation: Breath sounds:- clear  at anterior chest wall and - clear at posterior chest wall. Adventitious sounds:- No Adventitious sounds. Cardiovascular Auscultation:Rhythm- Regular rate and rhythm. Heart Sounds- S1 WNL and S2 WNL. Murmurs & Other Heart Sounds:Auscultation of the heart reveals - No Murmurs. Abdomen Palpation/Percussion:Tenderness- Abdomen is non-tender to palpation. Rigidity (guarding)- Abdomen is soft. Auscultation:Auscultation of the abdomen reveals - Bowel sounds normal. Female Genitourinary Not done, not pertinent to present illness Peripheral Vascular Upper Extremity: Palpation:- Pulses bilaterally normal. Lower Extremity: Palpation:- Pulses bilaterally normal. Neurologic Examination of related systems reveals - normal muscle strength and tone in all extremities. Neurologic evaluation reveals - normal sensation and upper and lower extremity deep tendon reflexes intact bilaterally . Musculoskeletal Right knee 5 to 115 degrees. Ligaments stable. Incision well healed. Mild soft tissue swelling. No joint effusion. Left knee moderate crepitus. 0 to 120 degrees. Tender lateral greater than medial. Valgus deformity. Mild soft tissue swelling. No joint effusion. Ligaments intact. Hips have normal painless ROM.   Radiographs: X-rays reveal good position of prosthesis in right knee. X-rays of left knee shows severe narrowing of the medial and patellofemoral compartments.  Assessment & Plan Osteoarthritis, knee (715.96) Left total knee arthroplasty    Dimitri Ped, PA-C

## 2011-11-28 ENCOUNTER — Encounter (HOSPITAL_COMMUNITY): Payer: Self-pay | Admitting: Registered Nurse

## 2011-11-28 ENCOUNTER — Ambulatory Visit (HOSPITAL_COMMUNITY): Payer: BC Managed Care – PPO

## 2011-11-28 ENCOUNTER — Encounter (HOSPITAL_COMMUNITY)
Admission: RE | Disposition: A | Discharge status: Skilled Nursing Facility | Payer: Self-pay | Source: Ambulatory Visit | Attending: Orthopedic Surgery

## 2011-11-28 ENCOUNTER — Ambulatory Visit (HOSPITAL_COMMUNITY): Payer: BC Managed Care – PPO | Admitting: Registered Nurse

## 2011-11-28 ENCOUNTER — Encounter (HOSPITAL_COMMUNITY): Payer: Self-pay | Admitting: *Deleted

## 2011-11-28 ENCOUNTER — Inpatient Hospital Stay (HOSPITAL_COMMUNITY)
Admission: RE | Admit: 2011-11-28 | Discharge: 2011-12-03 | DRG: 209 | Disposition: A | Discharge status: Skilled Nursing Facility | Payer: BC Managed Care – PPO | Source: Ambulatory Visit | Attending: Orthopedic Surgery | Admitting: Orthopedic Surgery

## 2011-11-28 DIAGNOSIS — I739 Peripheral vascular disease, unspecified: Secondary | ICD-10-CM | POA: Diagnosis present

## 2011-11-28 DIAGNOSIS — D62 Acute posthemorrhagic anemia: Secondary | ICD-10-CM | POA: Diagnosis not present

## 2011-11-28 DIAGNOSIS — K219 Gastro-esophageal reflux disease without esophagitis: Secondary | ICD-10-CM | POA: Diagnosis present

## 2011-11-28 DIAGNOSIS — T50995A Adverse effect of other drugs, medicaments and biological substances, initial encounter: Secondary | ICD-10-CM | POA: Diagnosis not present

## 2011-11-28 DIAGNOSIS — E039 Hypothyroidism, unspecified: Secondary | ICD-10-CM | POA: Diagnosis present

## 2011-11-28 DIAGNOSIS — K7689 Other specified diseases of liver: Secondary | ICD-10-CM | POA: Diagnosis present

## 2011-11-28 DIAGNOSIS — Z96659 Presence of unspecified artificial knee joint: Secondary | ICD-10-CM

## 2011-11-28 DIAGNOSIS — F29 Unspecified psychosis not due to a substance or known physiological condition: Secondary | ICD-10-CM | POA: Diagnosis not present

## 2011-11-28 DIAGNOSIS — E7800 Pure hypercholesterolemia, unspecified: Secondary | ICD-10-CM | POA: Diagnosis present

## 2011-11-28 DIAGNOSIS — F411 Generalized anxiety disorder: Secondary | ICD-10-CM | POA: Diagnosis present

## 2011-11-28 DIAGNOSIS — F3289 Other specified depressive episodes: Secondary | ICD-10-CM | POA: Diagnosis present

## 2011-11-28 DIAGNOSIS — I10 Essential (primary) hypertension: Secondary | ICD-10-CM | POA: Diagnosis present

## 2011-11-28 DIAGNOSIS — F329 Major depressive disorder, single episode, unspecified: Secondary | ICD-10-CM | POA: Diagnosis present

## 2011-11-28 DIAGNOSIS — Z86718 Personal history of other venous thrombosis and embolism: Secondary | ICD-10-CM

## 2011-11-28 DIAGNOSIS — E871 Hypo-osmolality and hyponatremia: Secondary | ICD-10-CM | POA: Diagnosis not present

## 2011-11-28 DIAGNOSIS — M1710 Unilateral primary osteoarthritis, unspecified knee: Principal | ICD-10-CM | POA: Diagnosis present

## 2011-11-28 DIAGNOSIS — Z01812 Encounter for preprocedural laboratory examination: Secondary | ICD-10-CM

## 2011-11-28 DIAGNOSIS — G4730 Sleep apnea, unspecified: Secondary | ICD-10-CM | POA: Diagnosis present

## 2011-11-28 DIAGNOSIS — F17200 Nicotine dependence, unspecified, uncomplicated: Secondary | ICD-10-CM | POA: Diagnosis present

## 2011-11-28 HISTORY — PX: TOTAL KNEE ARTHROPLASTY: SHX125

## 2011-11-28 LAB — TYPE AND SCREEN
ABO/RH(D): A NEG
Antibody Screen: NEGATIVE

## 2011-11-28 SURGERY — ARTHROPLASTY, KNEE, TOTAL
Anesthesia: General | Site: Knee | Laterality: Left | Wound class: Clean

## 2011-11-28 MED ORDER — METOPROLOL SUCCINATE ER 50 MG PO TB24
50.0000 mg | ORAL_TABLET | Freq: Every day | ORAL | Status: DC
  Administered 2011-11-29 – 2011-12-03 (×5): 50 mg via ORAL
  Filled 2011-11-28 (×5): qty 1

## 2011-11-28 MED ORDER — SCOPOLAMINE 1 MG/3DAYS TD PT72
MEDICATED_PATCH | TRANSDERMAL | Status: DC | PRN
  Administered 2011-11-28: 1 via TRANSDERMAL

## 2011-11-28 MED ORDER — HYDROMORPHONE HCL PF 1 MG/ML IJ SOLN
INTRAMUSCULAR | Status: DC | PRN
  Administered 2011-11-28 (×3): 1 mg via INTRAVENOUS

## 2011-11-28 MED ORDER — ALBUTEROL SULFATE (5 MG/ML) 0.5% IN NEBU
2.5000 mg | INHALATION_SOLUTION | Freq: Once | RESPIRATORY_TRACT | Status: AC
  Administered 2011-11-28: 2.5 mg via RESPIRATORY_TRACT
  Filled 2011-11-28: qty 0.5

## 2011-11-28 MED ORDER — THROMBIN 5000 UNITS EX SOLR
OROMUCOSAL | Status: DC | PRN
  Administered 2011-11-28: 10:00:00
  Administered 2011-11-28: 10:00:00 via TOPICAL

## 2011-11-28 MED ORDER — PROPOFOL 10 MG/ML IV EMUL
INTRAVENOUS | Status: DC | PRN
  Administered 2011-11-28: 75 ug/kg/min via INTRAVENOUS
  Administered 2011-11-28: 100 ug/kg/min via INTRAVENOUS

## 2011-11-28 MED ORDER — ONDANSETRON HCL 4 MG/2ML IJ SOLN
INTRAMUSCULAR | Status: DC | PRN
  Administered 2011-11-28: 4 mg via INTRAVENOUS

## 2011-11-28 MED ORDER — SUFENTANIL CITRATE 50 MCG/ML IV SOLN
INTRAVENOUS | Status: DC | PRN
  Administered 2011-11-28 (×2): 10 ug via INTRAVENOUS
  Administered 2011-11-28: 15 ug via INTRAVENOUS
  Administered 2011-11-28 (×2): 10 ug via INTRAVENOUS
  Administered 2011-11-28: 5 ug via INTRAVENOUS
  Administered 2011-11-28 (×2): 10 ug via INTRAVENOUS

## 2011-11-28 MED ORDER — HYDROMORPHONE HCL PF 1 MG/ML IJ SOLN
0.5000 mg | INTRAMUSCULAR | Status: DC | PRN
  Administered 2011-11-28 – 2011-11-29 (×2): 1 mg via INTRAVENOUS
  Filled 2011-11-28 (×2): qty 1

## 2011-11-28 MED ORDER — SERTRALINE HCL 100 MG PO TABS
100.0000 mg | ORAL_TABLET | Freq: Every day | ORAL | Status: DC
  Administered 2011-11-29 – 2011-12-03 (×5): 100 mg via ORAL
  Filled 2011-11-28 (×5): qty 1

## 2011-11-28 MED ORDER — BUPROPION HCL ER (XL) 300 MG PO TB24
300.0000 mg | ORAL_TABLET | Freq: Every day | ORAL | Status: DC
  Administered 2011-11-29 – 2011-12-03 (×5): 300 mg via ORAL
  Filled 2011-11-28 (×5): qty 1

## 2011-11-28 MED ORDER — BISACODYL 10 MG RE SUPP
10.0000 mg | Freq: Every day | RECTAL | Status: DC | PRN
  Administered 2011-12-02: 10 mg via RECTAL
  Filled 2011-11-28: qty 1

## 2011-11-28 MED ORDER — CEFAZOLIN SODIUM-DEXTROSE 2-3 GM-% IV SOLR
INTRAVENOUS | Status: AC
  Filled 2011-11-28: qty 50

## 2011-11-28 MED ORDER — LABETALOL HCL 5 MG/ML IV SOLN
INTRAVENOUS | Status: DC | PRN
  Administered 2011-11-28: 2.5 mg via INTRAVENOUS
  Administered 2011-11-28 (×2): 5 mg via INTRAVENOUS

## 2011-11-28 MED ORDER — ACETAMINOPHEN 650 MG RE SUPP: 650.0000 mg | Freq: Four times a day (QID) | RECTAL | Status: DC | PRN

## 2011-11-28 MED ORDER — METHOCARBAMOL 500 MG PO TABS
500.0000 mg | ORAL_TABLET | Freq: Four times a day (QID) | ORAL | Status: DC | PRN
  Administered 2011-11-29 – 2011-12-02 (×5): 500 mg via ORAL
  Filled 2011-11-28 (×5): qty 1

## 2011-11-28 MED ORDER — SCOPOLAMINE 1 MG/3DAYS TD PT72
MEDICATED_PATCH | TRANSDERMAL | Status: AC
  Filled 2011-11-28: qty 1

## 2011-11-28 MED ORDER — MENTHOL 3 MG MT LOZG
1.0000 | LOZENGE | OROMUCOSAL | Status: DC | PRN
  Administered 2011-12-02: 3 mg via ORAL
  Filled 2011-11-28: qty 9

## 2011-11-28 MED ORDER — SODIUM CHLORIDE 0.9 % IR SOLN
Status: DC | PRN
  Administered 2011-11-28: 3000 mL

## 2011-11-28 MED ORDER — DROPERIDOL 2.5 MG/ML IJ SOLN
INTRAMUSCULAR | Status: DC | PRN
  Administered 2011-11-28: 0.625 mg via INTRAVENOUS

## 2011-11-28 MED ORDER — BACITRACIN-NEOMYCIN-POLYMYXIN 400-5-5000 EX OINT
TOPICAL_OINTMENT | CUTANEOUS | Status: AC
  Filled 2011-11-28: qty 1

## 2011-11-28 MED ORDER — MEPERIDINE HCL 50 MG/ML IJ SOLN
6.2500 mg | INTRAMUSCULAR | Status: DC | PRN
Start: 2011-11-28 — End: 2011-11-28

## 2011-11-28 MED ORDER — THROMBIN 5000 UNITS EX SOLR
CUTANEOUS | Status: AC
  Filled 2011-11-28: qty 10000

## 2011-11-28 MED ORDER — BUPROPION HCL ER (XL) 300 MG PO TB24: 300.0000 mg | ORAL_TABLET | Freq: Every day | ORAL | Status: DC

## 2011-11-28 MED ORDER — CEFAZOLIN SODIUM 1-5 GM-% IV SOLN
1.0000 g | Freq: Four times a day (QID) | INTRAVENOUS | Status: AC
  Administered 2011-11-28 – 2011-11-29 (×3): 1 g via INTRAVENOUS
  Filled 2011-11-28 (×3): qty 50

## 2011-11-28 MED ORDER — METOPROLOL SUCCINATE ER 50 MG PO TB24: 50.0000 mg | ORAL_TABLET | Freq: Three times a day (TID) | ORAL | Status: DC

## 2011-11-28 MED ORDER — ONDANSETRON HCL 4 MG PO TABS
4.0000 mg | ORAL_TABLET | Freq: Every day | ORAL | Status: DC | PRN
  Administered 2011-12-01: 4 mg via ORAL
  Filled 2011-11-28: qty 1

## 2011-11-28 MED ORDER — METHOCARBAMOL 100 MG/ML IJ SOLN
500.0000 mg | Freq: Four times a day (QID) | INTRAVENOUS | Status: DC | PRN
  Administered 2011-11-28 (×2): 500 mg via INTRAVENOUS
  Filled 2011-11-28 (×2): qty 5

## 2011-11-28 MED ORDER — ALUM & MAG HYDROXIDE-SIMETH 200-200-20 MG/5ML PO SUSP: 30.0000 mL | ORAL | Status: DC | PRN

## 2011-11-28 MED ORDER — RIVAROXABAN 10 MG PO TABS
10.0000 mg | ORAL_TABLET | Freq: Every day | ORAL | Status: DC
  Administered 2011-11-29 – 2011-12-03 (×5): 10 mg via ORAL
  Filled 2011-11-28 (×5): qty 1

## 2011-11-28 MED ORDER — ACETAMINOPHEN 10 MG/ML IV SOLN
INTRAVENOUS | Status: DC | PRN
  Administered 2011-11-28: 1000 mg via INTRAVENOUS

## 2011-11-28 MED ORDER — LACTATED RINGERS IV SOLN: INTRAVENOUS | Status: DC

## 2011-11-28 MED ORDER — BUPIVACAINE LIPOSOME 1.3 % IJ SUSP
INTRAMUSCULAR | Status: DC | PRN
  Administered 2011-11-28: 20 mL

## 2011-11-28 MED ORDER — DEXAMETHASONE SODIUM PHOSPHATE 10 MG/ML IJ SOLN
INTRAMUSCULAR | Status: DC | PRN
  Administered 2011-11-28: 10 mg via INTRAVENOUS

## 2011-11-28 MED ORDER — LACTATED RINGERS IV SOLN
INTRAVENOUS | Status: DC | PRN
  Administered 2011-11-28 (×3): via INTRAVENOUS

## 2011-11-28 MED ORDER — CEFAZOLIN SODIUM-DEXTROSE 2-3 GM-% IV SOLR
2.0000 g | Freq: Once | INTRAVENOUS | Status: AC
  Administered 2011-11-28: 2 g via INTRAVENOUS

## 2011-11-28 MED ORDER — ALLOPURINOL 300 MG PO TABS
300.0000 mg | ORAL_TABLET | Freq: Every day | ORAL | Status: DC | PRN
  Filled 2011-11-28: qty 1

## 2011-11-28 MED ORDER — PROMETHAZINE HCL 25 MG/ML IJ SOLN: 6.2500 mg | INTRAMUSCULAR | Status: DC | PRN

## 2011-11-28 MED ORDER — ONDANSETRON HCL 4 MG PO TABS
4.0000 mg | ORAL_TABLET | Freq: Four times a day (QID) | ORAL | Status: DC | PRN
  Administered 2011-12-02: 4 mg via ORAL
  Filled 2011-11-28: qty 1

## 2011-11-28 MED ORDER — SODIUM CHLORIDE 0.9 % IJ SOLN
INTRAMUSCULAR | Status: DC | PRN
  Administered 2011-11-28: 50 mL via INTRAVENOUS

## 2011-11-28 MED ORDER — LIDOCAINE HCL 4 % MT SOLN
OROMUCOSAL | Status: DC | PRN
  Administered 2011-11-28: 4 mL via TOPICAL

## 2011-11-28 MED ORDER — LEVOTHYROXINE SODIUM 150 MCG PO TABS
150.0000 ug | ORAL_TABLET | Freq: Every day | ORAL | Status: DC
  Administered 2011-11-29 – 2011-12-03 (×5): 150 ug via ORAL
  Filled 2011-11-28 (×5): qty 1

## 2011-11-28 MED ORDER — ALBUTEROL SULFATE (5 MG/ML) 0.5% IN NEBU
INHALATION_SOLUTION | RESPIRATORY_TRACT | Status: AC
  Administered 2011-11-28: 2.5 mg via RESPIRATORY_TRACT
  Filled 2011-11-28: qty 0.5

## 2011-11-28 MED ORDER — PANTOPRAZOLE SODIUM 40 MG PO TBEC
40.0000 mg | DELAYED_RELEASE_TABLET | Freq: Every day | ORAL | Status: DC
  Administered 2011-11-28 – 2011-12-03 (×6): 40 mg via ORAL
  Filled 2011-11-28 (×6): qty 1

## 2011-11-28 MED ORDER — ACETAMINOPHEN 325 MG PO TABS: 650.0000 mg | ORAL_TABLET | Freq: Four times a day (QID) | ORAL | Status: DC | PRN

## 2011-11-28 MED ORDER — HYDROMORPHONE HCL PF 1 MG/ML IJ SOLN: 0.2500 mg | INTRAMUSCULAR | Status: DC | PRN

## 2011-11-28 MED ORDER — FERROUS SULFATE 325 (65 FE) MG PO TABS: 325.0000 mg | ORAL_TABLET | Freq: Three times a day (TID) | ORAL | Status: DC

## 2011-11-28 MED ORDER — ONDANSETRON HCL 4 MG/2ML IJ SOLN: 4.0000 mg | Freq: Four times a day (QID) | INTRAMUSCULAR | Status: DC | PRN

## 2011-11-28 MED ORDER — BUPIVACAINE LIPOSOME 1.3 % IJ SUSP
20.0000 mL | Freq: Once | INTRAMUSCULAR | Status: DC
  Filled 2011-11-28: qty 20

## 2011-11-28 MED ORDER — HYDROCODONE-ACETAMINOPHEN 10-325 MG PO TABS
1.0000 | ORAL_TABLET | ORAL | Status: DC | PRN
  Administered 2011-11-28: 1 via ORAL
  Administered 2011-11-29 – 2011-11-30 (×5): 2 via ORAL
  Administered 2011-11-30: 1 via ORAL
  Administered 2011-11-30 – 2011-12-02 (×6): 2 via ORAL
  Administered 2011-12-02: 1 via ORAL
  Administered 2011-12-02: 2 via ORAL
  Administered 2011-12-02 – 2011-12-03 (×4): 1 via ORAL
  Filled 2011-11-28: qty 2
  Filled 2011-11-28 (×2): qty 1
  Filled 2011-11-28: qty 2
  Filled 2011-11-28: qty 1
  Filled 2011-11-28 (×4): qty 2
  Filled 2011-11-28: qty 1
  Filled 2011-11-28: qty 2
  Filled 2011-11-28: qty 1
  Filled 2011-11-28: qty 2
  Filled 2011-11-28 (×2): qty 1
  Filled 2011-11-28 (×2): qty 2
  Filled 2011-11-28: qty 1
  Filled 2011-11-28: qty 2
  Filled 2011-11-28: qty 1

## 2011-11-28 MED ORDER — LIDOCAINE HCL (CARDIAC) 20 MG/ML IV SOLN
INTRAVENOUS | Status: DC | PRN
  Administered 2011-11-28: 50 mg via INTRAVENOUS

## 2011-11-28 MED ORDER — BUDESONIDE-FORMOTEROL FUMARATE 160-4.5 MCG/ACT IN AERO
2.0000 | INHALATION_SPRAY | Freq: Two times a day (BID) | RESPIRATORY_TRACT | Status: DC | PRN
  Filled 2011-11-28: qty 6

## 2011-11-28 MED ORDER — MIDAZOLAM HCL 5 MG/5ML IJ SOLN
INTRAMUSCULAR | Status: DC | PRN
  Administered 2011-11-28 (×2): 2 mg via INTRAVENOUS

## 2011-11-28 MED ORDER — PROPOFOL 10 MG/ML IV EMUL
INTRAVENOUS | Status: DC | PRN
  Administered 2011-11-28: 50 mg via INTRAVENOUS
  Administered 2011-11-28: 30 mg via INTRAVENOUS
  Administered 2011-11-28: 170 mg via INTRAVENOUS
  Administered 2011-11-28: 20 mg via INTRAVENOUS

## 2011-11-28 MED ORDER — SODIUM CHLORIDE 0.9 % IR SOLN
Status: DC | PRN
  Administered 2011-11-28: 09:00:00

## 2011-11-28 MED ORDER — CLONAZEPAM 0.5 MG PO TABS
0.5000 mg | ORAL_TABLET | Freq: Every day | ORAL | Status: DC
  Administered 2011-11-29 – 2011-12-03 (×5): 0.5 mg via ORAL
  Filled 2011-11-28 (×5): qty 1

## 2011-11-28 MED ORDER — LACTATED RINGERS IV SOLN
INTRAVENOUS | Status: DC
  Administered 2011-11-28: 14:00:00 via INTRAVENOUS

## 2011-11-28 MED ORDER — OXYCODONE-ACETAMINOPHEN 5-325 MG PO TABS: 1.0000 | ORAL_TABLET | ORAL | Status: DC | PRN

## 2011-11-28 MED ORDER — METOCLOPRAMIDE HCL 5 MG/ML IJ SOLN
INTRAMUSCULAR | Status: DC | PRN
  Administered 2011-11-28: 10 mg via INTRAVENOUS

## 2011-11-28 MED ORDER — PHENOL 1.4 % MT LIQD
1.0000 | OROMUCOSAL | Status: DC | PRN
  Administered 2011-12-02: 1 via OROMUCOSAL
  Filled 2011-11-28: qty 177

## 2011-11-28 MED ORDER — POLYETHYLENE GLYCOL 3350 17 G PO PACK
17.0000 g | PACK | Freq: Every day | ORAL | Status: DC | PRN
  Administered 2011-12-01: 17 g via ORAL
  Filled 2011-11-28 (×2): qty 1

## 2011-11-28 MED ORDER — FLEET ENEMA 7-19 GM/118ML RE ENEM: 1.0000 | ENEMA | Freq: Once | RECTAL | Status: AC | PRN

## 2011-11-28 MED ORDER — SUCCINYLCHOLINE CHLORIDE 20 MG/ML IJ SOLN
INTRAMUSCULAR | Status: DC | PRN
  Administered 2011-11-28: 100 mg via INTRAVENOUS
  Administered 2011-11-28: 80 mg via INTRAVENOUS

## 2011-11-28 MED ORDER — BACITRACIN-NEOMYCIN-POLYMYXIN 400-5-5000 EX OINT
TOPICAL_OINTMENT | CUTANEOUS | Status: DC | PRN
  Administered 2011-11-28: 1 via TOPICAL

## 2011-11-28 MED ORDER — ACETAMINOPHEN 10 MG/ML IV SOLN
INTRAVENOUS | Status: AC
  Filled 2011-11-28: qty 100

## 2011-11-28 SURGICAL SUPPLY — 71 items
BAG SPEC THK2 15X12 ZIP CLS (MISCELLANEOUS) ×1
BAG ZIPLOCK 12X15 (MISCELLANEOUS) ×2 IMPLANT
BANDAGE ELASTIC 4 VELCRO ST LF (GAUZE/BANDAGES/DRESSINGS) ×2 IMPLANT
BANDAGE ELASTIC 6 VELCRO ST LF (GAUZE/BANDAGES/DRESSINGS) ×2 IMPLANT
BANDAGE ESMARK 6X9 LF (GAUZE/BANDAGES/DRESSINGS) ×1 IMPLANT
BANDAGE GAUZE ELAST BULKY 4 IN (GAUZE/BANDAGES/DRESSINGS) ×2 IMPLANT
BLADE SAG 18X100X1.27 (BLADE) ×2 IMPLANT
BLADE SAW SGTL 13.0X1.19X90.0M (BLADE) ×2 IMPLANT
BNDG CMPR 9X6 STRL LF SNTH (GAUZE/BANDAGES/DRESSINGS) ×1
BNDG ESMARK 6X9 LF (GAUZE/BANDAGES/DRESSINGS) ×2
BONE CEMENT GENTAMICIN (Cement) ×4 IMPLANT
CEMENT BONE GENTAMICIN 40 (Cement) ×2 IMPLANT
CLOTH BEACON ORANGE TIMEOUT ST (SAFETY) ×2 IMPLANT
CUFF TOURN SGL QUICK 34 (TOURNIQUET CUFF) ×2
CUFF TRNQT CYL 34X4X40X1 (TOURNIQUET CUFF) ×1 IMPLANT
DRAPE EXTREMITY T 121X128X90 (DRAPE) ×2 IMPLANT
DRAPE INCISE IOBAN 66X45 STRL (DRAPES) ×2 IMPLANT
DRAPE LG THREE QUARTER DISP (DRAPES) ×4 IMPLANT
DRAPE POUCH INSTRU U-SHP 10X18 (DRAPES) ×2 IMPLANT
DRAPE U-SHAPE 47X51 STRL (DRAPES) ×2 IMPLANT
DRSG ADAPTIC 3X8 NADH LF (GAUZE/BANDAGES/DRESSINGS) ×2 IMPLANT
DRSG EMULSION OIL 3X16 NADH (GAUZE/BANDAGES/DRESSINGS) ×1 IMPLANT
DRSG PAD ABDOMINAL 8X10 ST (GAUZE/BANDAGES/DRESSINGS) ×5 IMPLANT
DURAPREP 26ML APPLICATOR (WOUND CARE) ×2 IMPLANT
ELECT REM PT RETURN 9FT ADLT (ELECTROSURGICAL) ×2
ELECTRODE REM PT RTRN 9FT ADLT (ELECTROSURGICAL) ×1 IMPLANT
EVACUATOR 1/8 PVC DRAIN (DRAIN) ×2 IMPLANT
FACESHIELD LNG OPTICON STERILE (SAFETY) ×11 IMPLANT
FLOSEAL 10ML (HEMOSTASIS) ×1 IMPLANT
GLOVE BIOGEL PI IND STRL 6.5 (GLOVE) ×2 IMPLANT
GLOVE BIOGEL PI IND STRL 8 (GLOVE) ×1 IMPLANT
GLOVE BIOGEL PI IND STRL 8.5 (GLOVE) IMPLANT
GLOVE BIOGEL PI INDICATOR 6.5 (GLOVE) ×2
GLOVE BIOGEL PI INDICATOR 8 (GLOVE) ×1
GLOVE BIOGEL PI INDICATOR 8.5 (GLOVE)
GLOVE ECLIPSE 8.0 STRL XLNG CF (GLOVE) ×4 IMPLANT
GOWN PREVENTION PLUS LG XLONG (DISPOSABLE) ×4 IMPLANT
GOWN STRL REIN XL XLG (GOWN DISPOSABLE) ×2 IMPLANT
HANDPIECE INTERPULSE COAX TIP (DISPOSABLE) ×2
IMMOBILIZER KNEE 20 (SOFTGOODS) ×2
IMMOBILIZER KNEE 20 THIGH 36 (SOFTGOODS) IMPLANT
KIT BASIN OR (CUSTOM PROCEDURE TRAY) ×2 IMPLANT
MANIFOLD NEPTUNE II (INSTRUMENTS) ×2 IMPLANT
NDL SAFETY ECLIPSE 18X1.5 (NEEDLE) IMPLANT
NEEDLE HYPO 18GX1.5 SHARP (NEEDLE) ×2
NEEDLE HYPO 22GX1.5 SAFETY (NEEDLE) ×2 IMPLANT
NS IRRIG 1000ML POUR BTL (IV SOLUTION) ×2 IMPLANT
PACK TOTAL JOINT (CUSTOM PROCEDURE TRAY) ×2 IMPLANT
PAD ABD 7.5X8 STRL (GAUZE/BANDAGES/DRESSINGS) IMPLANT
POSITIONER SURGICAL ARM (MISCELLANEOUS) ×2 IMPLANT
SET HNDPC FAN SPRY TIP SCT (DISPOSABLE) ×1 IMPLANT
SET PAD KNEE POSITIONER (MISCELLANEOUS) ×2 IMPLANT
SPONGE GAUZE 4X4 12PLY (GAUZE/BANDAGES/DRESSINGS) ×1 IMPLANT
SPONGE LAP 18X18 X RAY DECT (DISPOSABLE) ×2 IMPLANT
SPONGE SURGIFOAM ABS GEL 100 (HEMOSTASIS) ×3 IMPLANT
STAPLER VISISTAT 35W (STAPLE) ×2 IMPLANT
SUCTION FRAZIER 12FR DISP (SUCTIONS) ×2 IMPLANT
SUT BONE WAX W31G (SUTURE) ×2 IMPLANT
SUT VIC AB 0 CT1 27 (SUTURE) ×4
SUT VIC AB 0 CT1 27XBRD ANTBC (SUTURE) ×2 IMPLANT
SUT VIC AB 1 CT1 27 (SUTURE) ×10
SUT VIC AB 1 CT1 27XBRD ANTBC (SUTURE) ×5 IMPLANT
SUT VIC AB 2-0 CT1 27 (SUTURE) ×6
SUT VIC AB 2-0 CT1 TAPERPNT 27 (SUTURE) ×3 IMPLANT
SYR 20CC LL (SYRINGE) ×2 IMPLANT
SYR 50ML LL SCALE MARK (SYRINGE) ×1 IMPLANT
TOWEL OR 17X26 10 PK STRL BLUE (TOWEL DISPOSABLE) ×4 IMPLANT
TOWER CARTRIDGE SMART MIX (DISPOSABLE) ×2 IMPLANT
TRAY FOLEY CATH 14FRSI W/METER (CATHETERS) ×2 IMPLANT
WATER STERILE IRR 1500ML POUR (IV SOLUTION) ×2 IMPLANT
WRAP KNEE MAXI GEL POST OP (GAUZE/BANDAGES/DRESSINGS) ×4 IMPLANT

## 2011-11-28 NOTE — Transfer of Care (Signed)
Immediate Anesthesia Transfer of Care Note  Patient: Sonia Dawson  Procedure(s) Performed: Procedure(s) (LRB): TOTAL KNEE ARTHROPLASTY (Left)  Patient Location: PACU  Anesthesia Type: General  Level of Consciousness: awake, alert , oriented and patient cooperative  Airway & Oxygen Therapy: Patient Spontanous Breathing and Patient connected to face mask oxygen  Post-op Assessment: Report given to PACU RN, Post -op Vital signs reviewed and stable and Patient moving all extremities X 4  Post vital signs: stable  Complications: No apparent anesthesia complications

## 2011-11-28 NOTE — Anesthesia Preprocedure Evaluation (Addendum)
Anesthesia Evaluation  Patient identified by MRN, date of birth, ID band Patient awake    Reviewed: Allergy & Precautions, H&P , NPO status , Patient's Chart, lab work & pertinent test results, reviewed documented beta blocker date and time   History of Anesthesia Complications (+) PONV  Airway Mallampati: II TM Distance: >3 FB Neck ROM: full    Dental No notable dental hx. (+) Teeth Intact and Dental Advisory Given   Pulmonary neg pulmonary ROS, sleep apnea , COPD COPD inhaler,  Mild COPD breath sounds clear to auscultation  Pulmonary exam normal       Cardiovascular Exercise Tolerance: Good hypertension, On Home Beta Blockers negative cardio ROS  Rhythm:regular Rate:Normal     Neuro/Psych PSYCHIATRIC DISORDERS Anxiety Depression negative neurological ROS  negative psych ROS   GI/Hepatic negative GI ROS, Neg liver ROS, GERD-  Medicated,  Endo/Other  negative endocrine ROSHypothyroidism Morbid obesity  Renal/GU negative Renal ROS  negative genitourinary   Musculoskeletal   Abdominal   Peds  Hematology negative hematology ROS (+)   Anesthesia Other Findings   Reproductive/Obstetrics negative OB ROS                           Anesthesia Physical  Anesthesia Plan  ASA: II  Anesthesia Plan: General   Post-op Pain Management:    Induction:   Airway Management Planned: Oral ETT  Additional Equipment:   Intra-op Plan:   Post-operative Plan: Extubation in OR  Informed Consent: I have reviewed the patients History and Physical, chart, labs and discussed the procedure including the risks, benefits and alternatives for the proposed anesthesia with the patient or authorized representative who has indicated his/her understanding and acceptance.   Dental Advisory Given  Plan Discussed with: CRNA and Surgeon  Anesthesia Plan Comments:         Anesthesia Quick Evaluation

## 2011-11-28 NOTE — Brief Op Note (Signed)
11/28/2011  10:47 AM  PATIENT:  Sonia Dawson  61 y.o. female  PRE-OPERATIVE DIAGNOSIS:  osteoarthritis left knee  POST-OPERATIVE DIAGNOSIS:  Osteoarthritis left knee  PROCEDURE:  Procedure(s) (LRB): TOTAL KNEE ARTHROPLASTY (Left)  SURGEON:  Surgeon(s) and Role:    * Jacki Cones, MD - Primary  PHYSICIAN ASSISTANT: Dimitri Ped PA    ANESTHESIA:   general  EBL:  Total I/O In: 2000 [I.V.:2000] Out: 550 [Urine:150; Blood:400]  BLOOD ADMINISTERED:none  DRAINS: (one) Hemovact drain(s) in the left with  Suction Open   LOCAL MEDICATIONS USED:  BUPIVICAINE 20cc mixed with 40cc Normal Saline  SPECIMEN:  No Specimen  DISPOSITION OF SPECIMEN:  N/A  COUNTS:  YES  TOURNIQUET:   Total Tourniquet Time Documented: Thigh (Left) - 85 minutes  DICTATION: .Other Dictation: Dictation Number [id number]  PLAN OF CARE: Admit to inpatient   PATIENT DISPOSITION:  PACU - hemodynamically stable.   Delay start of Pharmacological VTE agent (>24hrs) due to surgical blood loss or risk of bleeding: yes

## 2011-11-28 NOTE — Anesthesia Procedure Notes (Signed)
Procedure Name: Intubation Performed by: Anastasio Champion E Pre-anesthesia Checklist: Patient identified, Emergency Drugs available, Suction available and Patient being monitored Patient Re-evaluated:Patient Re-evaluated prior to inductionOxygen Delivery Method: Circle system utilized Preoxygenation: Pre-oxygenation with 100% oxygen Intubation Type: IV induction Ventilation: Mask ventilation with difficulty and Oral airway inserted - appropriate to patient size Grade View: Grade III Tube type: Oral Number of attempts: 4 Airway Equipment and Method: Stylet,  Bougie stylet and Video-laryngoscopy Placement Confirmation: ETT inserted through vocal cords under direct vision,  positive ETCO2,  CO2 detector and breath sounds checked- equal and bilateral Secured at: 21 cm Tube secured with: Tape Dental Injury: Teeth and Oropharynx as per pre-operative assessment  Difficulty Due To: Difficulty was anticipated, Difficult Airway- due to large tongue, Difficult Airway- due to anterior larynx and Difficult Airway- due to limited oral opening Future Recommendations: Recommend- induction with short-acting agent, and alternative techniques readily available Comments: The glide scope may be a better alternative , the blade of the Macgrath was difficult to get int he mouth due to small opening,used a bougie that was difficult to pass thru cord despite several different bendings of the tip of the bougie.,Crna attempted  X 2, Dr. Acey Lav x 2

## 2011-11-28 NOTE — Anesthesia Postprocedure Evaluation (Signed)
  Anesthesia Post-op Note  Patient: Sonia Dawson  Procedure(s) Performed: Procedure(s) (LRB): TOTAL KNEE ARTHROPLASTY (Left)  Patient Location: PACU  Anesthesia Type: General  Level of Consciousness: awake and alert   Airway and Oxygen Therapy: Patient Spontanous Breathing  Post-op Pain: mild  Post-op Assessment: Post-op Vital signs reviewed, Patient's Cardiovascular Status Stable, Respiratory Function Stable, Patent Airway and No signs of Nausea or vomiting  Post-op Vital Signs: stable  Complications: No apparent anesthesia complications

## 2011-11-29 LAB — CBC
MCV: 81.4 fL (ref 78.0–100.0)
Platelets: 204 10*3/uL (ref 150–400)
RBC: 3.76 MIL/uL — ABNORMAL LOW (ref 3.87–5.11)
WBC: 10.3 10*3/uL (ref 4.0–10.5)

## 2011-11-29 LAB — BASIC METABOLIC PANEL
CO2: 26 mEq/L (ref 19–32)
Chloride: 100 mEq/L (ref 96–112)
Creatinine, Ser: 0.97 mg/dL (ref 0.50–1.10)
Potassium: 4 mEq/L (ref 3.5–5.1)

## 2011-11-29 NOTE — Progress Notes (Signed)
CSW assisting with d/c planning. Pt would like to have ST rehab at [facility]. SNFf contacted and has offered a bed pending BCBS approval. CSW will send clinicals to SNF to begin authorization process. Pt lives at home with her father and has minimal support available following hospitalization. cSW will follow to assist with d/c planning to SNF.  Cori Razor  LCSW  [phone]

## 2011-11-29 NOTE — Progress Notes (Signed)
Subjective: Sitting up on [place] and some pain in Knee as I would expect.   Objective: Vital signs in last 24 hours: Temp:  [98 F (36.7 C)-99.3 F (37.4 C)] 99.1 F (37.3 C) (04/11 1424) Pulse Rate:  [66-77] 73  (04/11 1424) Resp:  [14-16] 14  (04/11 1424) BP: (107-142)/(68-80) 107/68 mmHg (04/11 1424) SpO2:  [96 %-98 %] 96 % (04/11 1424)  Intake/Output from previous day: 04/10 0701 - 04/11 0700 In: 5563.3 [P.O.:1080; I.V.:4428.3; IV Piggyback:55] Out: 3185 [Urine:2325; Drains:460; Blood:400] Intake/Output this shift: Total I/O In: 240 [P.O.:240] Out: -    Basename 11/29/11 0425  HGB 9.8*    Basename 11/29/11 0425  WBC 10.3  RBC 3.76*  HCT 30.6*  PLT 204    Basename 11/29/11 0425  NA 135  K 4.0  CL 100  CO2 26  BUN 12  CREATININE 0.97  GLUCOSE 144*  CALCIUM 9.3   No results found for this basename: LABPT:2,INR:2 in the last 72 hours  Dorsiflexion/Plantar flexion intact  Assessment/Plan: Plan on DC Sunday.   Tatem Fesler A 11/29/2011, 7:41 PM
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
## 2011-11-29 NOTE — Op Note (Signed)
Sonia Dawson, Sonia Dawson                  ACCOUNT NO.:  [id number]  MEDICAL RECORD NO.:  [id number]  LOCATION:  1607                         FACILITY:  [facility]  PHYSICIAN:  Georges Lynch. Sharea Guinther, M.D.DATE OF BIRTH:  1951-06-28  DATE OF PROCEDURE:  11/28/2011 DATE OF DISCHARGE:                              OPERATIVE REPORT   SURGEON:  Georges Lynch. Darrelyn Hillock, M.D.  OPERATIVE ASSISTANT:  Dimitri Ped, PA  PREOPERATIVE DIAGNOSIS: 1. She had morbid obesity. 2. Severe degenerative arthritis with bone-on-bone of the left knee. 3. She had a previous right total knee arthroplasty.  OPERATION:  Left total knee arthroplasty utilizing a size 2.5 left posterior cruciate sacrificing femoral component, tibial tray was a 2.5 MBC revision tray and the insert was 10-mm thickness, 2.5 and a 38 patella.  The femur was a size 2.5, left.  DESCRIPTION OF PROCEDURE:  Under general anesthesia, routine orthopedic prep and draping left lower extremity was carried out.  At this time, appropriate time-out was carried out.  Prior to making the incision, I also marked the appropriate left leg in the holding area.  At this particular time, after sterile prep and draping, the patient had 2 g of IV Ancef prior to that.  Leg was placed into Mayo knee holder.  The leg was exsanguinated with Esmarch, tourniquet was elevated to 350 mmHg.  At this time, incision was made over the anterior aspect of left knee with the knee flexed.  Bleeders identified and cauterized.  I then carried out a median parapatellar incision.  Following that, I reflected the patella laterally flexed the knee and did medial and lateral meniscectomies and excised the anterior and posterior cruciate ligaments.  At this point, I measured the femur to be a size 2.5 after the initial drill hole was made in the intercondylar notch.  We did removed 12-mm thickness off the distal femur.  Following that I did my next cut with the anterior-posterior chamfering cuts  for a size 2.5 mm femoral component, left.  At this time, I then prepared the tibia for [facility] revision tray, size 2.5 diameter tray.  I made my appropriate keel cuts after we did the flexion and extension gap measurements.  We then completed the procedure on the tibia with the keel cut.  I then did my notch cut out of the distal femur in the usual fashion for a size 2.5 femur.  I then inserted my trial components and we felt that the 10-mm thickness insert was the best insert for stability of medial and lateral plane flexion and extension.  After the trials were inserted, I then kept those in and then did a resurfacing procedure on the patella in the usual fashion.  I did 3 drill holes in the patellar articular surface. At this particular time, we then removed all trial components, thoroughly water picked out the knee and cemented all 3 components in simultaneously.  I did use Exparel 20 cc with 40 cc of normal saline prior to closing the wound and also before I inserted my permanent tray. We removed our trial tray and inserted our permanent tray, 10-mm thickness and 2.5-mm diameter.  We searched for loose cement,  removed all loose cement and water picked out the knee and made sure all loose cement was absent.  I then inserted Hemovac drain and closed the knee in layered usual fashion.  Gentamicin was used in the cement.  She also had 2 g of IV Ancef.  After the wound was closed, sterile drapes dressings were applied.  We did use staples to close the skin.          ______________________________ Georges Lynch. Darrelyn Hillock, M.D.     RAG/MEDQ  D:  11/28/2011  T:  11/29/2011  Job:  [id number]

## 2011-11-29 NOTE — Progress Notes (Signed)
Physical Therapy Treatment Patient Details Name: Sonia Dawson MRN: [id number] DOB: [birth year] Today's Date: 11/29/2011 [id number] PT Assessment/Plan  PT - Assessment/Plan Comments on Treatment Session: pt continues to have pain but does try hard w/ mobility. did well with transfers this session. PT Plan: Discharge plan remains appropriate PT Frequency: 7X/week Recommendations for Other Services: OT consult Follow Up Recommendations: Skilled nursing facility Equipment Recommended: None recommended by PT PT Goals  Acute Rehab PT Goals PT Goal Formulation: With patient Time For Goal Achievement: 7 days Pt will go Supine/Side to Sit: with supervision PT Goal: Supine/Side to Sit - Progress: Goal set today Pt will go Sit to Supine/Side: with supervision PT Goal: Sit to Supine/Side - Progress: Progressing toward goal Pt will go Sit to Stand: with supervision PT Goal: Sit to Stand - Progress: Progressing toward goal Pt will go Stand to Sit: with supervision PT Goal: Stand to Sit - Progress: Progressing toward goal Pt will Transfer Bed to Chair/Chair to Bed: with supervision PT Transfer Goal: Bed to Chair/Chair to Bed - Progress: Progressing toward goal Pt will Ambulate: 16 - 50 feet;with supervision;with rolling walker PT Goal: Ambulate - Progress: Progressing toward goal Pt will Perform Home Exercise Program: with supervision, verbal cues required/provided PT Goal: Perform Home Exercise Program - Progress: Goal set today  PT Treatment Precautions/Restrictions  Precautions Precautions: Knee Required Braces or Orthoses: Knee Immobilizer - Left Knee Immobilizer - Left: Discontinue once straight leg raise with < 10 degree lag Restrictions Weight Bearing Restrictions: No Mobility (including Balance) Bed Mobility Bed Mobility: Yes Sit to Supine: 4: Min assist Sit to Supine - Details (indicate cue type and reason): assistance to place LLE onto bed Transfers Sit to Stand: 3: Mod  assist;From chair/3-in-1;With upper extremity assist;With armrests Sit to Stand Details (indicate cue type and reason): VC to try to push from armrests, pt has to lean forward then straighten up., pt did stand and balance w/ 1 UE and perform self pericare after toileting Stand to Sit: 4: Min assist;To bed;To elevated surface;With upper extremity assist Stand to Sit Details: vc to place LLE forward and reach to bed rail. Ambulation/Gait Ambulation/Gait: Yes Ambulation/Gait Assistance: 3: Mod assist Ambulation/Gait Assistance Details (indicate cue type and reason): pt pivoted from recliner to [facility] then took 5 steps backward to bed. Ambulation Distance (Feet): 5 Feet (backward only) Assistive device: Rolling walker Gait Pattern: Step-to pattern    Exercise pt declined at this time  End of Session PT - End of Session Equipment Utilized During Treatment: Left knee immobilizer Activity Tolerance: Patient limited by fatigue;Patient limited by pain Patient left: in bed;with family/visitor present;with call bell in reach Nurse Communication: Mobility status for transfers General Behavior During Session: [facility] for tasks performed Cognition: [facility] for tasks performed  [name] 11/29/2011, 3:20 PM
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
## 2011-11-30 LAB — BASIC METABOLIC PANEL
BUN: 10 mg/dL (ref 6–23)
CO2: 30 mEq/L (ref 19–32)
Chloride: 96 mEq/L (ref 96–112)
Creatinine, Ser: 0.92 mg/dL (ref 0.50–1.10)
Glucose, Bld: 115 mg/dL — ABNORMAL HIGH (ref 70–99)
Potassium: 3.9 mEq/L (ref 3.5–5.1)

## 2011-11-30 LAB — CBC
HCT: 29.4 % — ABNORMAL LOW (ref 36.0–46.0)
Hemoglobin: 9.6 g/dL — ABNORMAL LOW (ref 12.0–15.0)
MCV: 80.8 fL (ref 78.0–100.0)
RBC: 3.64 MIL/uL — ABNORMAL LOW (ref 3.87–5.11)
WBC: 8.7 10*3/uL (ref 4.0–10.5)

## 2011-11-30 NOTE — Progress Notes (Signed)
Physical Therapy Treatment Patient Details Name: Sonia Dawson MRN: [id number] DOB: March 11, 1951 Today's Date: 11/30/2011 1341-1402 PT Assessment/Plan  PT - Assessment/Plan Comments on Treatment Session: pt improving in mobility, stamina, decreased pain. pt will benefit from SNF  PT Plan: Discharge plan remains appropriate;Frequency remains appropriate PT Frequency: 7X/week Follow Up Recommendations: Skilled nursing facility Equipment Recommended: None recommended by PT PT Goals  Acute Rehab PT Goals Pt will go Sit to Supine/Side: with supervision PT Goal: Sit to Supine/Side - Progress: Progressing toward goal Pt will go Sit to Stand: with supervision PT Goal: Sit to Stand - Progress: Progressing toward goal Pt will go Stand to Sit: with supervision PT Goal: Stand to Sit - Progress: Progressing toward goal Pt will Transfer Bed to Chair/Chair to Bed: with supervision PT Transfer Goal: Bed to Chair/Chair to Bed - Progress: Progressing toward goal Pt will Ambulate: 16 - 50 feet;with supervision;with rolling walker PT Goal: Ambulate - Progress: Progressing toward goal Pt will Perform Home Exercise Program: with supervision, verbal cues required/provided PT Goal: Perform Home Exercise Program - Progress: Progressing toward goal  PT Treatment Precautions/Restrictions  Precautions Precautions: Knee Required Braces or Orthoses: Knee Immobilizer - Left Knee Immobilizer - Left: On at all times;Discontinue once straight leg raise with < 10 degree lag Restrictions Weight Bearing Restrictions: No Mobility (including Balance) Bed Mobility Sit to Supine: 4: Min assist Sit to Supine - Details (indicate cue type and reason): support  LLE onto bed Transfers Sit to Stand: From chair/3-in-1;3: Mod assist;With upper extremity assist Sit to Stand Details (indicate cue type and reason): vc to push from armrests,  Stand to Sit: 4: Min assist;To chair/3-in-1;To bed Stand to Sit Details: pt able to step  LLE forward prior to sitting Ambulation/Gait Ambulation/Gait Assistance: 4: Min assist Ambulation/Gait Assistance Details (indicate cue type and reason): vc for sequence, posture Ambulation Distance (Feet): 20 Feet (20x2) Assistive device: Rolling walker Gait Pattern: Step-to pattern;Antalgic Gait velocity: slower than expected    Exercise  Total Joint Exercises Ankle Circles/Pumps: AROM;Both;15 reps;Supine Quad Sets: AROM;Left;10 reps;Supine Short Arc Quad: AAROM;Left;10 reps;Supine Heel Slides: AAROM;Left;10 reps;Supine Hip ABduction/ADduction: AROM;Left;10 reps;Supine Straight Leg Raises: AAROM;Left;10 reps;Supine (used sheet to assist self) End of Session PT - End of Session Equipment Utilized During Treatment: Left knee immobilizer Activity Tolerance: Patient tolerated treatment well Patient left: in bed;with call bell in reach;with family/visitor present Nurse Communication: Mobility status for transfers General Behavior During Session: [facility] for tasks performed Cognition: [facility] for tasks performed  [name] 11/30/2011, 4:15 PM

## 2011-11-30 NOTE — Progress Notes (Signed)
Subjective: Doing well.Dressing changed and wound is dry.[facility] on Monday.   Objective: Vital signs in last 24 hours: Temp:  [98 F (36.7 C)-99.1 F (37.3 C)] 98.8 F (37.1 C) (04/12 0422) Pulse Rate:  [72-82] 82  (04/12 0422) Resp:  [14-16] 16  (04/12 0422) BP: (107-126)/(68-80) 126/80 mmHg (04/12 0422) SpO2:  [87 %-97 %] 93 % (04/12 0422)  Intake/Output from previous day: 04/11 0701 - 04/12 0700 In: 1825 [P.O.:920; I.V.:905] Out: 1775 [Urine:1775] Intake/Output this shift:     Basename 11/30/11 0405 11/29/11 0425  HGB 9.6* 9.8*    Basename 11/30/11 0405 11/29/11 0425  WBC 8.7 10.3  RBC 3.64* 3.76*  HCT 29.4* 30.6*  PLT 202 204    Basename 11/30/11 0405 11/29/11 0425  NA 134* 135  K 3.9 4.0  CL 96 100  CO2 30 26  BUN 10 12  CREATININE 0.92 0.97  GLUCOSE 115* 144*  CALCIUM 9.1 9.3   No results found for this basename: LABPT:2,INR:2 in the last 72 hours  No cellulitis present  Assessment/Plan: SNF on Monday   Dorotea Hand A 11/30/2011, 7:21 AM

## 2011-11-30 NOTE — Progress Notes (Signed)
CARE MANAGEMENT NOTE 11/30/2011  Patient:  Sonia Dawson, Sonia Dawson   Account Number:  [id number]  Date Initiated:  11/30/2011  Documentation initiated by:  Colleen Can  Subjective/Objective Assessment:   dx osteoarthritis left knee; total knee replacemnt on day of admission     Action/Plan:   Plans are for SNF rehab   Anticipated DC Date:  12/01/2011   Anticipated DC Plan:  SKILLED NURSING FACILITY  In-house referral  Clinical Social Worker      DC Planning Services  CM consult      [facility] Choice  NA   Choice offered to / List presented to:  NA   DME arranged  NA      DME agency  NA     HH arranged  NA      HH agency  NA   Status of service:  Completed, signed off Medicare Important Message given?  NO (If response is "NO", the following Medicare IM given date fields will be blank) Comments:  11/30/2011 Colleen Can, RN BSN CCM [phone]  CSW following patient for SNF rehab, CM signing off.

## 2011-11-30 NOTE — Progress Notes (Signed)
Physical Therapy Treatment Patient Details Name: Sonia Dawson MRN: [id number] DOB: Nov 10, 1950 Today's Date: 11/30/[phone] PT Assessment/Plan  PT - Assessment/Plan Comments on Treatment Session: pt is moving much better today. continues to have urgency for urinating(present PTA).  pt will need rehab. PT Plan: Discharge plan remains appropriate;Frequency remains appropriate PT Frequency: 7X/week Follow Up Recommendations: Skilled nursing facility Equipment Recommended: None recommended by PT PT Goals  Acute Rehab PT Goals Pt will go Supine/Side to Sit: with supervision PT Goal: Supine/Side to Sit - Progress: Progressing toward goal Pt will go Sit to Supine/Side: with supervision PT Goal: Sit to Supine/Side - Progress: Progressing toward goal Pt will go Sit to Stand: with supervision PT Goal: Sit to Stand - Progress: Progressing toward goal Pt will go Stand to Sit: with supervision PT Goal: Stand to Sit - Progress: Progressing toward goal Pt will Transfer Bed to Chair/Chair to Bed: with supervision PT Transfer Goal: Bed to Chair/Chair to Bed - Progress: Progressing toward goal Pt will Ambulate: 16 - 50 feet PT Goal: Ambulate - Progress: Progressing toward goal  PT Treatment Precautions/Restrictions  Precautions Precautions: Knee Required Braces or Orthoses: Knee Immobilizer - Left Knee Immobilizer - Left: Discontinue once straight leg raise with < 10 degree lag Restrictions Weight Bearing Restrictions: No Mobility (including Balance) Bed Mobility Supine to Sit: 3: Mod assist;HOB elevated (Comment degrees);With rails Supine to Sit Details (indicate cue type and reason): HOB 40, pt used pad to self a;ide to edge, support of Lleg by PT Transfers Sit to Stand: 4: Min assist;From bed;With upper extremity assist;From elevated surface;From chair/3-in-1 Sit to Stand Details (indicate cue type and reason): vc to push from bed/armrests Stand to Sit: 4: Min assist;To  chair/3-in-1 Stand to Sit Details: vc to place Lelg forward, reach baclk to armrests Stand Pivot Transfers: 4: Min assist Stand Pivot Transfer Details (indicate cue type and reason): pt used RW to pivot from bed to bsc  Ambulation/Gait Ambulation/Gait Assistance: 4: Min assist Ambulation Distance (Feet): 5 Feet Assistive device: Rolling walker Gait Pattern: Step-to pattern    Exercise    End of Session PT - End of Session Equipment Utilized During Treatment: Left knee immobilizer Activity Tolerance: Patient tolerated treatment well Patient left: in chair;with call bell in reach;with family/visitor present Nurse Communication: Mobility status for transfers General Behavior During Session: [facility] for tasks performed Cognition: [facility] for tasks performed  [name] 11/30/2011, 10:12 AM
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
## 2011-12-01 LAB — CBC
HCT: 27.9 % — ABNORMAL LOW (ref 36.0–46.0)
Hemoglobin: 9 g/dL — ABNORMAL LOW (ref 12.0–15.0)
MCHC: 32.3 g/dL (ref 30.0–36.0)
MCV: 81.1 fL (ref 78.0–100.0)
RDW: 15.4 % (ref 11.5–15.5)

## 2011-12-01 NOTE — Progress Notes (Signed)
Physical Therapy Treatment Patient Details Name: Sonia Dawson MRN: [id number] DOB: 01/23/1951 Today's Date: 12/01/2011  PT Assessment/Plan  PT - Assessment/Plan Comments on Treatment Session: Pt doing better with mobility and ambulation, however noted that pt continues to have decreased endurance.  PT Plan: Discharge plan remains appropriate;Frequency remains appropriate PT Frequency: 7X/week Follow Up Recommendations: Skilled nursing facility Equipment Recommended: None recommended by PT PT Goals  Acute Rehab PT Goals PT Goal Formulation: With patient Time For Goal Achievement: 7 days Pt will go Supine/Side to Sit: with supervision PT Goal: Supine/Side to Sit - Progress: Progressing toward goal Pt will go Sit to Stand: with supervision PT Goal: Sit to Stand - Progress: Progressing toward goal Pt will go Stand to Sit: with supervision PT Goal: Stand to Sit - Progress: Met Pt will Ambulate: 51 - 150 feet;with supervision;with rolling walker PT Goal: Ambulate - Progress: Updated due to goal met  PT Treatment Precautions/Restrictions  Precautions Precautions: Knee Required Braces or Orthoses: Knee Immobilizer - Left Knee Immobilizer - Left: On at all times;Discontinue once straight leg raise with < 10 degree lag Restrictions Weight Bearing Restrictions: No Mobility (including Balance) Bed Mobility Bed Mobility: Yes Supine to Sit: 4: Min assist;HOB elevated (Comment degrees);With rails Supine to Sit Details (indicate cue type and reason): Assist for LLE off of bed with cues for UE placement to self assist trunk.  Transfers Transfers: Yes Sit to Stand: 4: Min assist;With upper extremity assist;From elevated surface;From bed Sit to Stand Details (indicate cue type and reason): Min/guard for safety with cues for hand placement on bed.  Stand to Sit: 5: Supervision;With upper extremity assist;With armrests;To chair/3-in-1 Stand to Sit Details: Supervision and cues for safety due to  pt reaching for chair and leaving RW to the side.  Cues for hand placement and controlled descent.  Ambulation/Gait Ambulation/Gait: Yes Ambulation/Gait Assistance: 4: Min assist Ambulation/Gait Assistance Details (indicate cue type and reason): min/guard for safety with cues for upright posture.  Pt demos good sequencing/technique.  Ambulation Distance (Feet): 70 Feet (then another 15') Assistive device: Rolling walker Gait Pattern: Step-to pattern;Antalgic Gait velocity: improving    Exercise    End of Session PT - End of Session Equipment Utilized During Treatment: Left knee immobilizer Activity Tolerance: Patient tolerated treatment well Patient left: in chair;with call bell in reach;with family/visitor present General Behavior During Session: [facility] for tasks performed Cognition: [name] for tasks performed  Page, Meribeth Mattes 12/01/2011, 12:06 PM

## 2011-12-01 NOTE — Progress Notes (Signed)
Physical Therapy Treatment Patient Details Name: Sonia Dawson MRN: [id number] DOB: August 28, 1950 Today's Date: 12/01/2011  PT Assessment/Plan  PT - Assessment/Plan Comments on Treatment Session: Pt agreed to perform exercises in bed,however declined ambulation due to fatigue.   PT Plan: Discharge plan remains appropriate;Frequency remains appropriate PT Frequency: 7X/week Follow Up Recommendations: Skilled nursing facility Equipment Recommended: None recommended by PT PT Goals  Acute Rehab PT Goals PT Goal Formulation: With patient Time For Goal Achievement: 7 days Pt will go Supine/Side to Sit: with supervision PT Goal: Supine/Side to Sit - Progress: Progressing toward goal Pt will go Sit to Stand: with supervision PT Goal: Sit to Stand - Progress: Progressing toward goal Pt will go Stand to Sit: with supervision PT Goal: Stand to Sit - Progress: Progressing toward goal Pt will Ambulate: 51 - 150 feet;with supervision;with rolling walker PT Goal: Ambulate - Progress: Updated due to goal met Pt will Perform Home Exercise Program: with supervision, verbal cues required/provided PT Goal: Perform Home Exercise Program - Progress: Progressing toward goal  PT Treatment Precautions/Restrictions  Precautions Precautions: Knee Required Braces or Orthoses: Knee Immobilizer - Left Knee Immobilizer - Left: On at all times;Discontinue once straight leg raise with < 10 degree lag Restrictions Weight Bearing Restrictions: No Mobility (including Balance) None  Exercise  Total Joint Exercises Ankle Circles/Pumps: AROM;Both;20 reps;Supine Quad Sets: AROM;Left;10 reps;Supine Short Arc Quad: AROM;Left;10 reps;Supine Heel Slides: AAROM;Left;10 reps;Supine Hip ABduction/ADduction: AROM;Left;10 reps;Supine Straight Leg Raises: AAROM;Left;10 reps;Supine End of Session PT - End of Session Equipment Utilized During Treatment: Left knee immobilizer Activity Tolerance: Patient tolerated treatment  well Patient left: in bed;with call bell in reach General Behavior During Session: [facility] for tasks performed Cognition: [facility] for tasks performed  Page, Meribeth Mattes 12/01/2011, 3:39 PM

## 2011-12-01 NOTE — Progress Notes (Signed)
Subjective: Procedure(s) (LRB): TOTAL KNEE ARTHROPLASTY (Left) 3 Days Post-Op  Patient reports pain as moderate. Positive void Negative bowel movement Positive flatus Negative chest pain or shortness of breath  Objective: Vital signs in last 24 hours: Temp:  [98.6 F (37 C)-99.4 F (37.4 C)] 99.4 F (37.4 C) (04/13 0536) Pulse Rate:  [73-82] 82  (04/13 0536) Resp:  [14-16] 16  (04/13 0536) BP: (111-130)/(73-83) 130/83 mmHg (04/13 0536) SpO2:  [92 %-97 %] 92 % (04/13 0536)  Intake/Output from previous day: 04/12 0701 - 04/13 0700 In: 1620 [P.O.:1620] Out: 1350 [Urine:1350]   Basename 12/01/11 0503 11/30/11 0405  WBC 8.8 8.7  RBC 3.44* 3.64*  HCT 27.9* 29.4*  PLT 201 202    Basename 11/30/11 0405 11/29/11 0425  NA 134* 135  K 3.9 4.0  CL 96 100  CO2 30 26  BUN 10 12  CREATININE 0.92 0.97  GLUCOSE 115* 144*  CALCIUM 9.1 9.3   No results found for this basename: LABPT:2,INR:2 in the last 72 hours  Neurologically intact ABD soft Sensation intact distally Intact pulses distally Dorsiflexion/Plantar flexion intact Incision: dressing C/D/I Compartment soft  Assessment/Plan: Patient stable  Continue mobilization with physical therapy  Continue care  DC to [facility] place Monday   Gwinda Maine 12/01/2011, 8:50 AM

## 2011-12-02 NOTE — Progress Notes (Signed)
Physical Therapy Treatment Patient Details Name: MARBELLA MARKGRAF MRN: [id number] DOB: May 01, 1951 Today's Date: 12/02/2011  PT Assessment/Plan  PT - Assessment/Plan Comments on Treatment Session: Pt with increased pain this morning and had just received pain medication.  Pt only agreed to ambulate to/from restroom.  PT Plan: Discharge plan remains appropriate;Frequency remains appropriate PT Frequency: 7X/week Follow Up Recommendations: Skilled nursing facility Equipment Recommended: None recommended by PT PT Goals  Acute Rehab PT Goals PT Goal Formulation: With patient Time For Goal Achievement: 7 days Pt will go Supine/Side to Sit: with supervision PT Goal: Supine/Side to Sit - Progress: Progressing toward goal Pt will go Sit to Stand: with supervision PT Goal: Sit to Stand - Progress: Progressing toward goal Pt will go Stand to Sit: with supervision PT Goal: Stand to Sit - Progress: Met Pt will Ambulate: 51 - 150 feet;with supervision;with rolling walker PT Goal: Ambulate - Progress: Progressing toward goal  PT Treatment Precautions/Restrictions  Precautions Precautions: Knee Required Braces or Orthoses: Knee Immobilizer - Left Knee Immobilizer - Left: On at all times;Discontinue once straight leg raise with < 10 degree lag Restrictions Weight Bearing Restrictions: No Mobility (including Balance) Bed Mobility Bed Mobility: Yes Supine to Sit: 4: Min assist;HOB elevated (Comment degrees) Supine to Sit Details (indicate cue type and reason): Assist for LLE off of bed.  Somewhat more assist during today's session due to increased pain.  Transfers Transfers: Yes Sit to Stand: 4: Min assist;With upper extremity assist;From bed;From elevated surface Sit to Stand Details (indicate cue type and reason): Min/guard for safety with continued cues for hand placement due to tendecny to reach for RW with B UE (Performed x 2 from bed and from 3in1. ) Stand to Sit: 5: Supervision;With upper  extremity assist;With armrests;To chair/3-in-1 Stand to Sit Details: Supervision for safety with cues for hand placement and LE management.  (Performed x 2 to 3in1 and to recliner. ) Ambulation/Gait Ambulation/Gait: Yes Ambulation/Gait Assistance: 4: Min assist Ambulation/Gait Assistance Details (indicate cue type and reason): Min/guard for safety with cues for RW placement.   Ambulation Distance (Feet): 15 Feet (then another 15') Assistive device: Rolling walker Gait Pattern: Step-to pattern;Antalgic  Balance Balance Assessed: Yes Dynamic Standing Balance Dynamic Standing - Balance Support: No upper extremity supported Dynamic Standing - Level of Assistance: 5: Stand by assistance Dynamic Standing - Balance Activities: Lateral lean/weight shifting;Forward lean/weight shifting;Reaching for objects;Reaching across midline Dynamic Standing - Comments: Performed reaching for functional activities while in restroom with stand by assist x 3 mins.  Exercise    End of Session PT - End of Session Activity Tolerance: Patient limited by pain Patient left: in chair;with call bell in reach;with family/visitor present General Behavior During Session: [facility] for tasks performed Cognition: [facility] for tasks performed  Page, Meribeth Mattes 12/02/2011, 9:14 AM

## 2011-12-02 NOTE — Progress Notes (Signed)
Physical Therapy Treatment Patient Details Name: JANETE QUILLING MRN: [id number] DOB: 1951/04/07 Today's Date: 12/02/2011  PT Assessment/Plan  PT - Assessment/Plan Comments on Treatment Session: Pt continues to have increased pain, however per RN, she was slightly confused with too much pain medication.  Continue to provide max encouragement for pt to ambulate.  PT Plan: Discharge plan remains appropriate;Frequency remains appropriate PT Frequency: 7X/week Follow Up Recommendations: Skilled nursing facility Equipment Recommended: None recommended by PT PT Goals  Acute Rehab PT Goals PT Goal Formulation: With patient Time For Goal Achievement: 7 days Pt will go Sit to Supine/Side: with supervision PT Goal: Sit to Supine/Side - Progress: Progressing toward goal Pt will go Sit to Stand: with supervision PT Goal: Sit to Stand - Progress: Met Pt will go Stand to Sit: with supervision PT Goal: Stand to Sit - Progress: Met Pt will Ambulate: 51 - 150 feet;with supervision;with rolling walker PT Goal: Ambulate - Progress: Progressing toward goal Pt will Perform Home Exercise Program: with supervision, verbal cues required/provided PT Goal: Perform Home Exercise Program - Progress: Progressing toward goal  PT Treatment Precautions/Restrictions  Precautions Precautions: Knee Required Braces or Orthoses: Knee Immobilizer - Left Knee Immobilizer - Left: On at all times;Discontinue once straight leg raise with < 10 degree lag Restrictions Weight Bearing Restrictions: No Mobility (including Balance) Bed Mobility Bed Mobility: Yes Sit to Supine: 4: Min assist Sit to Supine - Details (indicate cue type and reason): Requires assist for LLE into bed.  Transfers Transfers: Yes Sit to Stand: 5: Supervision;With upper extremity assist;With armrests;From chair/3-in-1 Sit to Stand Details (indicate cue type and reason): Supervision for safety with cues for B UE placement on chair arm rests for  increased stability.  Stand to Sit: 5: Supervision;Without upper extremity assist;To elevated surface;To bed Stand to Sit Details: Cues for hand placement and controlled descent.  Ambulation/Gait Ambulation/Gait: Yes Ambulation/Gait Assistance: 4: Min assist Ambulation/Gait Assistance Details (indicate cue type and reason): Min/guard for safety with cues for upright posture and for not pivoting on LLE when turning left.  Ambulation Distance (Feet): 60 Feet Assistive device: Rolling walker Gait Pattern: Step-to pattern;Antalgic Gait velocity: Continues to be slow    Exercise  Total Joint Exercises Ankle Circles/Pumps: AROM;Supine;Both;10 reps Quad Sets: AROM;Left;10 reps;Supine Short Arc Quad: AROM;Left;10 reps;Supine Heel Slides: AAROM;Left;10 reps;Supine Hip ABduction/ADduction: AROM;Left;10 reps;Supine Straight Leg Raises: AAROM;Left;10 reps;Supine End of Session PT - End of Session Activity Tolerance: Patient limited by pain Patient left: in bed;with call bell in reach;with family/visitor present Nurse Communication: Other (comment) (RN notified of pts request for pain meds) General Behavior During Session: [facility] for tasks performed Cognition: [facility] for tasks performed  Page, Meribeth Mattes 12/02/2011, 2:08 PM

## 2011-12-02 NOTE — Progress Notes (Signed)
Subjective: 4 Days Post-Op Procedure(s) (LRB): TOTAL KNEE ARTHROPLASTY (Left)   Patient reports pain as mild. Pain well controlled. No events throughout the night.  Objective:   VITALS:   Filed Vitals:   12/02/11 0530  BP: 111/77  Pulse: 75  Temp: 98.3 F (36.8 C)  Resp: 16    Neurovascular intact Dorsiflexion/Plantar flexion intact Incision: dressing C/D/I No cellulitis present Compartment soft  LABS  Basename 12/01/11 0503 11/30/11 0405  HGB 9.0* 9.6*  HCT 27.9* 29.4*  WBC 8.8 8.7  PLT 201 202     Basename 11/30/11 0405  NA 134*  K 3.9  BUN 10  CREATININE 0.92  GLUCOSE 115*     Assessment/Plan: 4 Days Post-Op Procedure(s) (LRB): TOTAL KNEE ARTHROPLASTY (Left)   Dressing changed without difficulty, incision looks good. Up with therapy Discharge to SNF tomorrow if continues to do well    Anastasio Auerbach. Tykeshia Tourangeau   PAC  12/02/2011, 8:35 AM

## 2011-12-03 MED ORDER — OXYCODONE-ACETAMINOPHEN 5-325 MG PO TABS: 1.0000 | ORAL_TABLET | ORAL | Status: AC | PRN

## 2011-12-03 MED ORDER — RIVAROXABAN 10 MG PO TABS: 10.0000 mg | ORAL_TABLET | Freq: Every day | ORAL | Status: DC

## 2011-12-03 MED ORDER — METHOCARBAMOL 500 MG PO TABS: 500.0000 mg | ORAL_TABLET | Freq: Four times a day (QID) | ORAL | Status: AC | PRN

## 2011-12-03 NOTE — Progress Notes (Signed)
Subjective: Wound looks fine but minimal erythema from Sub Q bleeding. Afebrile. Ambulating well.   Objective: Vital signs in last 24 hours: Temp:  [98.1 F (36.7 C)-98.9 F (37.2 C)] 98.2 F (36.8 C) (04/15 0500) Pulse Rate:  [69-86] 69  (04/15 0500) Resp:  [16-20] 20  (04/15 0500) BP: (102-154)/(63-98) 135/84 mmHg (04/15 0500) SpO2:  [90 %-93 %] 90 % (04/15 0500)  Intake/Output from previous day: 04/14 0701 - 04/15 0700 In: 480 [P.O.:480] Out: 1300 [Urine:1300] Intake/Output this shift:     Basename 12/01/11 0503  HGB 9.0*    Basename 12/01/11 0503  WBC 8.8  RBC 3.44*  HCT 27.9*  PLT 201   No results found for this basename: NA:2,K:2,CL:2,CO2:2,BUN:2,CREATININE:2,GLUCOSE:2,CALCIUM:2 in the last 72 hours No results found for this basename: LABPT:2,INR:2 in the last 72 hours  Dorsiflexion/Plantar flexion intact Compartment soft  Assessment/Plan: SNF Today   Sonia Dawson A 12/03/2011, 7:04 AM

## 2011-12-03 NOTE — Progress Notes (Signed)
Iv site d/c, pressure dressing applied.  Pt being d/c to [address] [facility], daughter is transporting pt to the facility via car.  Gave daughter the packet to give the facility.  Pt being d/c at this time.

## 2011-12-03 NOTE — Progress Notes (Signed)
Physical Therapy Treatment Patient Details Name: VEYDA KAUFMAN MRN: [id number] DOB: 12-11-1950 Today's Date: 12/03/2011  PT Assessment/Plan  PT - Assessment/Plan Comments on Treatment Session: Pt states that her pain is better controlled today and also demo's improved mobility during session. Pt to D/C today.  PT Plan: Discharge plan remains appropriate;Frequency remains appropriate PT Frequency: 7X/week Recommendations for Other Services: OT consult Follow Up Recommendations: Skilled nursing facility Equipment Recommended: None recommended by PT PT Goals  Acute Rehab PT Goals PT Goal Formulation: With patient Time For Goal Achievement: 7 days Pt will go Supine/Side to Sit: with supervision PT Goal: Supine/Side to Sit - Progress: Met Pt will go Sit to Stand: with supervision PT Goal: Sit to Stand - Progress: Met Pt will go Stand to Sit: with supervision PT Goal: Stand to Sit - Progress: Met Pt will Ambulate: 51 - 150 feet;with supervision;with rolling walker PT Goal: Ambulate - Progress: Progressing toward goal Pt will Perform Home Exercise Program: with supervision, verbal cues required/provided PT Goal: Perform Home Exercise Program - Progress: Progressing toward goal  PT Treatment Precautions/Restrictions  Precautions Precautions: Knee Required Braces or Orthoses: Knee Immobilizer - Left Knee Immobilizer - Left: On at all times;Discontinue once straight leg raise with < 10 degree lag Restrictions Weight Bearing Restrictions: No Mobility (including Balance) Bed Mobility Bed Mobility: Yes Supine to Sit: 5: Supervision;HOB elevated (Comment degrees);With rails Supine to Sit Details (indicate cue type and reason): Supervision for safety.  Min cues for hand placement.  Transfers Transfers: Yes Sit to Stand: 5: Supervision;With upper extremity assist;From elevated surface;From bed Sit to Stand Details (indicate cue type and reason): Supervision for safety with min cues for hand  placement.   (Performed x 2 from bed and from 3in1. ) Stand to Sit: 5: Supervision;With upper extremity assist;With armrests;To chair/3-in-1 Stand to Sit Details: Min cues for controlled descent and hand placement.  Ambulation/Gait Ambulation/Gait: Yes Ambulation/Gait Assistance: 4: Min assist Ambulation/Gait Assistance Details (indicate cue type and reason): Min/guard for slight instability noted with cues for upright posture.  Ambulation Distance (Feet): 15 Feet (then another 15') Assistive device: Rolling walker Gait Pattern: Step-to pattern;Antalgic Gait velocity: Continues to be slow  Balance Balance Assessed: Yes Dynamic Standing Balance Dynamic Standing - Balance Support: No upper extremity supported Dynamic Standing - Level of Assistance: 5: Stand by assistance Dynamic Standing - Balance Activities: Lateral lean/weight shifting;Forward lean/weight shifting Dynamic Standing - Comments: Continues to require stand  by assist for performing functional activities involving reaching and lateral leaning.  Exercise  Total Joint Exercises Ankle Circles/Pumps: AROM;Both;10 reps;Seated Quad Sets: AROM;Left;10 reps;Seated Short Arc Quad: AROM;Left;10 reps;Seated Heel Slides: AAROM;Left;10 reps;Seated Hip ABduction/ADduction: AROM;Left;10 reps;Seated Straight Leg Raises: AAROM;Left;10 reps;Seated End of Session PT - End of Session Activity Tolerance: Patient tolerated treatment well Patient left: in chair;with call bell in reach General Behavior During Session: [facility] for tasks performed Cognition: [facility] for tasks performed  Page, Meribeth Mattes 12/03/2011, 9:25 AM
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
## 2011-12-03 NOTE — Progress Notes (Addendum)
Patient is cleared for discharge. Notified [facility]. They are beginning the auth process with [employer].  Karri Kallenbach C. Aaniya Sterba MSW, [place] [phone] Berkley Harvey obtained. Packet copied and given to family who will transport. Quatavious Rossa C. Tawsha Terrero MSW, LCSW [phone]

## 2011-12-04 NOTE — Progress Notes (Signed)
Discharge summary sent to payer through [employer]  

## 2011-12-12 ENCOUNTER — Encounter (HOSPITAL_COMMUNITY): Payer: Self-pay | Admitting: Orthopedic Surgery

## 2012-01-16 ENCOUNTER — Telehealth: Payer: Self-pay

## 2012-01-16 NOTE — Telephone Encounter (Signed)
Please advise if ok to refill hydrocodone 5-500 for this patient. She was last seen 09/11/11 (no follow up appt indicated on pt instruction), medication last filled 12/03/11

## 2012-01-18 ENCOUNTER — Other Ambulatory Visit: Payer: Self-pay

## 2012-01-18 MED ORDER — SERTRALINE HCL 100 MG PO TABS: 100.0000 mg | ORAL_TABLET | Freq: Every day | ORAL | Status: DC

## 2012-01-21 ENCOUNTER — Telehealth: Payer: Self-pay

## 2012-01-21 DIAGNOSIS — M159 Polyosteoarthritis, unspecified: Secondary | ICD-10-CM

## 2012-01-21 MED ORDER — HYDROCODONE-ACETAMINOPHEN 5-500 MG PO TABS: 2.0000 | ORAL_TABLET | Freq: Four times a day (QID) | ORAL | Status: AC | PRN

## 2012-01-21 NOTE — Telephone Encounter (Signed)
yes

## 2012-01-21 NOTE — Telephone Encounter (Signed)
Please advise if ok to refill hydrocodone 5- 500 2 tab every 6 hr prn

## 2012-01-21 NOTE — Telephone Encounter (Signed)
Rx called in 

## 2012-02-01 ENCOUNTER — Other Ambulatory Visit (INDEPENDENT_AMBULATORY_CARE_PROVIDER_SITE_OTHER): Payer: BC Managed Care – PPO

## 2012-02-01 ENCOUNTER — Ambulatory Visit (INDEPENDENT_AMBULATORY_CARE_PROVIDER_SITE_OTHER): Payer: BC Managed Care – PPO | Admitting: Internal Medicine

## 2012-02-01 ENCOUNTER — Encounter: Payer: Self-pay | Admitting: Internal Medicine

## 2012-02-01 VITALS — BP 140/80 | HR 64 | Temp 97.0°F | Resp 16 | Ht 70.0 in | Wt 249.8 lb

## 2012-02-01 DIAGNOSIS — M159 Polyosteoarthritis, unspecified: Secondary | ICD-10-CM

## 2012-02-01 DIAGNOSIS — R7309 Other abnormal glucose: Secondary | ICD-10-CM

## 2012-02-01 DIAGNOSIS — E039 Hypothyroidism, unspecified: Secondary | ICD-10-CM

## 2012-02-01 DIAGNOSIS — D649 Anemia, unspecified: Secondary | ICD-10-CM

## 2012-02-01 DIAGNOSIS — I10 Essential (primary) hypertension: Secondary | ICD-10-CM

## 2012-02-01 LAB — CBC WITH DIFFERENTIAL/PLATELET
Basophils Absolute: 0 10*3/uL (ref 0.0–0.1)
Basophils Relative: 0.6 % (ref 0.0–3.0)
Eosinophils Absolute: 0.2 10*3/uL (ref 0.0–0.7)
HCT: 33.9 % — ABNORMAL LOW (ref 36.0–46.0)
Hemoglobin: 10.9 g/dL — ABNORMAL LOW (ref 12.0–15.0)
Lymphocytes Relative: 40.3 % (ref 12.0–46.0)
Lymphs Abs: 2.2 10*3/uL (ref 0.7–4.0)
MCHC: 32.3 g/dL (ref 30.0–36.0)
Monocytes Relative: 7.2 % (ref 3.0–12.0)
Neutro Abs: 2.6 10*3/uL (ref 1.4–7.7)
RBC: 4.29 Mil/uL (ref 3.87–5.11)
RDW: 15.8 % — ABNORMAL HIGH (ref 11.5–14.6)

## 2012-02-01 LAB — COMPREHENSIVE METABOLIC PANEL
ALT: 14 U/L (ref 0–35)
BUN: 14 mg/dL (ref 6–23)
CO2: 26 mEq/L (ref 19–32)
Calcium: 9.6 mg/dL (ref 8.4–10.5)
Chloride: 105 mEq/L (ref 96–112)
Creatinine, Ser: 1 mg/dL (ref 0.4–1.2)
GFR: 57.39 mL/min — ABNORMAL LOW (ref >60.00)
Total Bilirubin: 0.6 mg/dL (ref 0.3–1.2)

## 2012-02-01 LAB — IBC PANEL
Iron: 26 ug/dL — ABNORMAL LOW (ref 42–145)
Saturation Ratios: 7.5 % — ABNORMAL LOW (ref 20.0–50.0)
Transferrin: 246.8 mg/dL (ref 212.0–360.0)

## 2012-02-01 MED ORDER — FERRALET 90 90-1 MG PO TABS: 1.0000 | ORAL_TABLET | Freq: Every day | ORAL | Status: DC

## 2012-02-01 NOTE — Assessment & Plan Note (Signed)
I will recheck her TSH today 

## 2012-02-01 NOTE — Assessment & Plan Note (Addendum)
Her iron level remains low, I have asked her to try ferralet, if she can't tolerate iron po she will need to see hematology to consider an iron infusion, also I have asked her to get a GI eval for blood loss

## 2012-02-01 NOTE — Progress Notes (Signed)
Subjective:    Patient ID: Sonia Dawson, female    DOB: [birth year], 61 y.o.   MRN: [id number]  Anemia Presents for follow-up visit. There has been no abdominal pain, anorexia, bruising/bleeding easily, confusion, fever, leg swelling, light-headedness, malaise/fatigue, pallor, palpitations, paresthesias, pica or weight loss. Signs of blood loss that are not present include hematemesis, hematochezia, melena and vaginal bleeding. Compliance problems include medication side effects.  Compliance with medications is 0-25%. Side effects of medications include GI discomfort.  Thyroid Problem Presents for follow-up visit. Symptoms include anxiety, fatigue and hair loss. Patient reports no cold intolerance, constipation, depressed mood, diaphoresis, diarrhea, dry skin, heat intolerance, hoarse voice, leg swelling, menstrual problem, nail problem, palpitations, tremors, visual change, weight gain or weight loss. The symptoms have been stable.      Review of Systems  Constitutional: Positive for fatigue. Negative for fever, chills, weight loss, weight gain, malaise/fatigue, diaphoresis, activity change, appetite change and unexpected weight change.  HENT: Negative.  Negative for hoarse voice.   Eyes: Negative.   Respiratory: Negative for apnea, cough, choking, chest tightness, shortness of breath, wheezing and stridor.   Cardiovascular: Negative for chest pain, palpitations and leg swelling.  Gastrointestinal: Negative for nausea, vomiting, abdominal pain, diarrhea, constipation, blood in stool, melena, hematochezia, abdominal distention, anal bleeding, rectal pain, anorexia and hematemesis.  Genitourinary: Negative.  Negative for vaginal bleeding and menstrual problem.  Musculoskeletal: Positive for arthralgias (left knee, post op issue being treated by Dr. Darrelyn Hillock, she is on doxy). Negative for myalgias, back pain, joint swelling and gait problem.  Skin: Positive for wound (left knee, 2 months post L  TKR). Negative for color change, pallor and rash.  Neurological: Negative for dizziness, tremors, seizures, syncope, facial asymmetry, speech difficulty, weakness, light-headedness, numbness, headaches and paresthesias.  Hematological: Negative for cold intolerance, heat intolerance and adenopathy. Does not bruise/bleed easily.  Psychiatric/Behavioral: Negative for suicidal ideas, hallucinations, behavioral problems, confusion, disturbed wake/sleep cycle, self-injury, dysphoric mood, decreased concentration and agitation. The patient is nervous/anxious. The patient is not hyperactive.        Objective:   Physical Exam  Vitals reviewed. Constitutional: She is oriented to person, place, and time. She appears well-developed and well-nourished. No distress.  HENT:  Head: Normocephalic and atraumatic.  Mouth/Throat: Oropharynx is clear and moist. No oropharyngeal exudate.  Eyes: Conjunctivae are normal. Right eye exhibits no discharge. Left eye exhibits no discharge. No scleral icterus.  Neck: Normal range of motion. Neck supple. No JVD present. No tracheal deviation present. No thyromegaly present.  Cardiovascular: Normal rate, regular rhythm, normal heart sounds and intact distal pulses.  Exam reveals no gallop and no friction rub.   No murmur heard. Pulmonary/Chest: Effort normal and breath sounds normal. No stridor. No respiratory distress. She has no wheezes. She has no rales. She exhibits no tenderness.  Abdominal: Soft. Bowel sounds are normal. She exhibits no distension and no mass. There is no tenderness. There is no rebound and no guarding.  Musculoskeletal: Normal range of motion. She exhibits no edema and no tenderness.       Right knee: Normal.       Left knee: She exhibits deformity. She exhibits normal range of motion, no swelling, no effusion, no ecchymosis, no laceration and no erythema. no tenderness found.       Legs: Lymphadenopathy:    She has no cervical adenopathy.    Neurological: She is oriented to person, place, and time.  Skin: Skin is warm and dry. No rash noted. She  is not diaphoretic. No erythema. No pallor.  Psychiatric: She has a normal mood and affect. Her behavior is normal. Judgment and thought content normal.     Lab Results  Component Value Date   WBC 8.8 12/01/2011   HGB 9.0* 12/01/2011   HCT 27.9* 12/01/2011   PLT 201 12/01/2011   GLUCOSE 115* 11/30/2011   ALT 16 11/23/2011   AST 31 11/23/2011   NA 134* 11/30/2011   K 3.9 11/30/2011   CL 96 11/30/2011   CREATININE 0.92 11/30/2011   BUN 10 11/30/2011   CO2 30 11/30/2011   TSH 2.49 08/16/2011   INR 0.97 11/23/2011   HGBA1C 4.9 08/16/2011       Assessment & Plan:

## 2012-02-01 NOTE — Assessment & Plan Note (Signed)
I will check her a1c today to see if she has developed DM II 

## 2012-02-01 NOTE — Assessment & Plan Note (Signed)
She is s/p L TKR, labs (CBC, ESR, CRP) ordered today at the request of Dr. Darrelyn Hillock and the results have been faxed to him

## 2012-02-01 NOTE — Assessment & Plan Note (Signed)
BP is well controlled, I will check her lytes and renal function today 

## 2012-02-01 NOTE — Patient Instructions (Signed)
Hypothyroidism The thyroid is a large gland located in the lower front of your neck. The thyroid gland helps control metabolism. Metabolism is how your body handles food. It controls metabolism with the hormone thyroxine. When this gland is underactive (hypothyroid), it produces too little hormone.  CAUSES These include:   Absence or destruction of thyroid tissue.   Goiter due to iodine deficiency.   Goiter due to medications.   Congenital defects (since birth).   Problems with the pituitary. This causes a lack of TSH (thyroid stimulating hormone). This hormone tells the thyroid to turn out more hormone.  SYMPTOMS  Lethargy (feeling as though you have no energy)   Cold intolerance   Weight gain (in spite of normal food intake)   Dry skin   Coarse hair   Menstrual irregularity (if severe, may lead to infertility)   Slowing of thought processes  Cardiac problems are also caused by insufficient amounts of thyroid hormone. Hypothyroidism in the newborn is cretinism, and is an extreme form. It is important that this form be treated adequately and immediately or it will lead rapidly to retarded physical and mental development. DIAGNOSIS  To prove hypothyroidism, your caregiver may do blood tests and ultrasound tests. Sometimes the signs are hidden. It may be necessary for your caregiver to watch this illness with blood tests either before or after diagnosis and treatment. TREATMENT  Low levels of thyroid hormone are increased by using synthetic thyroid hormone. This is a safe, effective treatment. It usually takes about four weeks to gain the full effects of the medication. After you have the full effect of the medication, it will generally take another four weeks for problems to leave. Your caregiver may start you on low doses. If you have had heart problems the dose may be gradually increased. It is generally not an emergency to get rapidly to normal. HOME CARE INSTRUCTIONS   Take  your medications as your caregiver suggests. Let your caregiver know of any medications you are taking or start taking. Your caregiver will help you with dosage schedules.   As your condition improves, your dosage needs may increase. It will be necessary to have continuing blood tests as suggested by your caregiver.   Report all suspected medication side effects to your caregiver.  SEEK MEDICAL CARE IF: Seek medical care if you develop:  Sweating.   Tremulousness (tremors).   Anxiety.   Rapid weight loss.   Heat intolerance.   Emotional swings.   Diarrhea.   Weakness.  SEEK IMMEDIATE MEDICAL CARE IF:  You develop chest pain, an irregular heart beat (palpitations), or a rapid heart beat. MAKE SURE YOU:   Understand these instructions.   Will watch your condition.   Will get help right away if you are not doing well or get worse.  Document Released: 08/06/2005 Document Revised: 07/26/2011 Document Reviewed: 03/26/2008 Exit[employer] Patient Information 2012 [employer].Anemia, Nonspecific Your exam and blood tests show you are anemic. This means your blood (hemoglobin) level is low. Normal hemoglobin values are 12 to 15 g/dL for females and 14 to 17 g/dL for males. Make a note of your hemoglobin level today. The hematocrit percent is also used to measure anemia. A normal hematocrit is 38% to 46% in females and 42% to 49% in males. Make a note of your hematocrit level today. CAUSES  Anemia can be due to many different causes.  Excessive bleeding from periods (in women).   Intestinal bleeding.   Poor nutrition.   Kidney,   thyroid, liver, and bone marrow diseases.  SYMPTOMS  Anemia can come on suddenly (acute). It can also come on slowly. Symptoms can include:  Minor weakness.   Dizziness.   Palpitations.   Shortness of breath.  Symptoms may be absent until half your hemoglobin is missing if it comes on slowly. Anemia due to acute blood loss from an injury or internal  bleeding may require blood transfusion if the loss is severe. Hospital care is needed if you are anemic and there is significant continual blood loss. TREATMENT   Stool tests for blood (Hemoccult) and additional lab tests are often needed. This determines the best treatment.   Further checking on your condition and your response to treatment is very important. It often takes many weeks to correct anemia.  Depending on the cause, treatment can include:  Supplements of iron.   Vitamins B12 and folic acid.   Hormone medicines.If your anemia is due to bleeding, finding the cause of the blood loss is very important. This will help avoid further problems.  SEEK IMMEDIATE MEDICAL CARE IF:   You develop fainting, extreme weakness, shortness of breath, or chest pain.   You develop heavy vaginal bleeding.   You develop bloody or black, tarry stools or vomit up blood.   You develop a high fever, rash, repeated vomiting, or dehydration.  Document Released: 09/13/2004 Document Revised: 07/26/2011 Document Reviewed: 06/21/2009 Exit[employer] Patient Information 2012 [employer]. 

## 2012-02-06 ENCOUNTER — Telehealth: Payer: Self-pay | Admitting: Internal Medicine

## 2012-02-06 NOTE — Telephone Encounter (Signed)
Pt wants to know why she was referred to gastroenterology? Request call back

## 2012-02-06 NOTE — Telephone Encounter (Signed)
Anemia, to see if she is losing blood in her intestines, has she ever had a colonoscopy?

## 2012-02-07 NOTE — Telephone Encounter (Signed)
Patient notified per and states that she has not had a colonoscopy since age 61. She will proceed with appt to GI.

## 2012-02-08 ENCOUNTER — Ambulatory Visit: Payer: BC Managed Care – PPO | Admitting: Gastroenterology

## 2012-02-12 ENCOUNTER — Telehealth: Payer: Self-pay | Admitting: Gastroenterology

## 2012-02-12 ENCOUNTER — Ambulatory Visit: Payer: BC Managed Care – PPO | Admitting: Gastroenterology

## 2012-02-12 NOTE — Telephone Encounter (Signed)
No charge. 

## 2012-02-29 ENCOUNTER — Encounter (HOSPITAL_COMMUNITY): Payer: Self-pay | Admitting: Pharmacy Technician
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
## 2012-03-03 ENCOUNTER — Encounter (HOSPITAL_COMMUNITY): Payer: Self-pay | Admitting: *Deleted

## 2012-03-03 NOTE — Pre-Procedure Instructions (Signed)
Pre op phone call completed day before surgery. Pt states has non healing left open knee with bloody drainage- states will cover it before betasept shower- to use antibacterial soap on skin only- regular soap face and privates with no shaving.  Verbal read back of meds to be taken before arrival tomorrow. CHEST X RAY WITH EKG   EPIC 11/12 and on chart

## 2012-03-04 ENCOUNTER — Encounter (HOSPITAL_COMMUNITY)
Admission: RE | Disposition: A | Discharge status: Home / Self Care | Payer: Self-pay | Source: Ambulatory Visit | Attending: Orthopedic Surgery

## 2012-03-04 ENCOUNTER — Encounter (HOSPITAL_COMMUNITY): Payer: Self-pay | Admitting: Anesthesiology

## 2012-03-04 ENCOUNTER — Observation Stay (HOSPITAL_COMMUNITY)
Admission: RE | Admit: 2012-03-04 | Discharge: 2012-03-05 | DRG: 453 | Disposition: A | Discharge status: Home / Self Care | Payer: BC Managed Care – PPO | Source: Ambulatory Visit | Attending: Orthopedic Surgery | Admitting: Orthopedic Surgery

## 2012-03-04 ENCOUNTER — Encounter (HOSPITAL_COMMUNITY): Payer: Self-pay | Admitting: *Deleted

## 2012-03-04 ENCOUNTER — Ambulatory Visit (HOSPITAL_COMMUNITY): Payer: BC Managed Care – PPO | Admitting: Anesthesiology

## 2012-03-04 DIAGNOSIS — Z86718 Personal history of other venous thrombosis and embolism: Secondary | ICD-10-CM | POA: Insufficient documentation

## 2012-03-04 DIAGNOSIS — Y831 Surgical operation with implant of artificial internal device as the cause of abnormal reaction of the patient, or of later complication, without mention of misadventure at the time of the procedure: Secondary | ICD-10-CM | POA: Insufficient documentation

## 2012-03-04 DIAGNOSIS — I739 Peripheral vascular disease, unspecified: Secondary | ICD-10-CM | POA: Insufficient documentation

## 2012-03-04 DIAGNOSIS — F329 Major depressive disorder, single episode, unspecified: Secondary | ICD-10-CM | POA: Insufficient documentation

## 2012-03-04 DIAGNOSIS — T148XXA Other injury of unspecified body region, initial encounter: Secondary | ICD-10-CM

## 2012-03-04 DIAGNOSIS — K7689 Other specified diseases of liver: Secondary | ICD-10-CM | POA: Insufficient documentation

## 2012-03-04 DIAGNOSIS — G4730 Sleep apnea, unspecified: Secondary | ICD-10-CM | POA: Insufficient documentation

## 2012-03-04 DIAGNOSIS — F3289 Other specified depressive episodes: Secondary | ICD-10-CM | POA: Insufficient documentation

## 2012-03-04 DIAGNOSIS — F17200 Nicotine dependence, unspecified, uncomplicated: Secondary | ICD-10-CM | POA: Insufficient documentation

## 2012-03-04 DIAGNOSIS — Z79899 Other long term (current) drug therapy: Secondary | ICD-10-CM | POA: Insufficient documentation

## 2012-03-04 DIAGNOSIS — T8189XA Other complications of procedures, not elsewhere classified, initial encounter: Principal | ICD-10-CM | POA: Insufficient documentation

## 2012-03-04 DIAGNOSIS — Z96659 Presence of unspecified artificial knee joint: Secondary | ICD-10-CM | POA: Insufficient documentation

## 2012-03-04 DIAGNOSIS — M129 Arthropathy, unspecified: Secondary | ICD-10-CM | POA: Insufficient documentation

## 2012-03-04 DIAGNOSIS — I10 Essential (primary) hypertension: Secondary | ICD-10-CM | POA: Insufficient documentation

## 2012-03-04 DIAGNOSIS — F411 Generalized anxiety disorder: Secondary | ICD-10-CM | POA: Insufficient documentation

## 2012-03-04 DIAGNOSIS — K219 Gastro-esophageal reflux disease without esophagitis: Secondary | ICD-10-CM | POA: Insufficient documentation

## 2012-03-04 DIAGNOSIS — E039 Hypothyroidism, unspecified: Secondary | ICD-10-CM | POA: Insufficient documentation

## 2012-03-04 HISTORY — PX: IRRIGATION AND DEBRIDEMENT KNEE: SHX5185

## 2012-03-04 HISTORY — PX: KNEE CLOSED REDUCTION: SHX995

## 2012-03-04 HISTORY — DX: Anemia, unspecified: D64.9

## 2012-03-04 LAB — BASIC METABOLIC PANEL
BUN: 11 mg/dL (ref 6–23)
Chloride: 103 mEq/L (ref 96–112)
Creatinine, Ser: 0.86 mg/dL (ref 0.50–1.10)
GFR calc Af Amer: 83 mL/min — ABNORMAL LOW (ref >90)
GFR calc non Af Amer: 72 mL/min — ABNORMAL LOW (ref >90)

## 2012-03-04 LAB — CBC
HCT: 32.3 % — ABNORMAL LOW (ref 36.0–46.0)
MCH: 24.6 pg — ABNORMAL LOW (ref 26.0–34.0)
MCHC: 32.2 g/dL (ref 30.0–36.0)
MCV: 76.5 fL — ABNORMAL LOW (ref 78.0–100.0)
RDW: 15.2 % (ref 11.5–15.5)

## 2012-03-04 LAB — URINALYSIS, ROUTINE W REFLEX MICROSCOPIC
Glucose, UA: NEGATIVE mg/dL
Ketones, ur: NEGATIVE mg/dL
pH: 6 (ref 5.0–8.0)

## 2012-03-04 LAB — TYPE AND SCREEN

## 2012-03-04 LAB — SURGICAL PCR SCREEN: MRSA, PCR: NEGATIVE

## 2012-03-04 LAB — URINE MICROSCOPIC-ADD ON

## 2012-03-04 SURGERY — IRRIGATION AND DEBRIDEMENT KNEE
Anesthesia: General | Site: Knee | Laterality: Left | Wound class: Clean

## 2012-03-04 MED ORDER — PHENOL 1.4 % MT LIQD: 1.0000 | OROMUCOSAL | Status: DC | PRN

## 2012-03-04 MED ORDER — ZOLPIDEM TARTRATE 5 MG PO TABS: 5.0000 mg | ORAL_TABLET | Freq: Every evening | ORAL | Status: DC | PRN

## 2012-03-04 MED ORDER — GLYCOPYRROLATE 0.2 MG/ML IJ SOLN
INTRAMUSCULAR | Status: DC | PRN
  Administered 2012-03-04: 0.6 mg via INTRAVENOUS

## 2012-03-04 MED ORDER — MENTHOL 3 MG MT LOZG: 1.0000 | LOZENGE | OROMUCOSAL | Status: DC | PRN

## 2012-03-04 MED ORDER — POLYETHYLENE GLYCOL 3350 17 G PO PACK: 17.0000 g | PACK | Freq: Two times a day (BID) | ORAL | Status: DC

## 2012-03-04 MED ORDER — SCOPOLAMINE 1 MG/3DAYS TD PT72
MEDICATED_PATCH | TRANSDERMAL | Status: DC | PRN
  Administered 2012-03-04: 1.5 mg via TRANSDERMAL

## 2012-03-04 MED ORDER — LACTATED RINGERS IV SOLN: INTRAVENOUS | Status: DC

## 2012-03-04 MED ORDER — SCOPOLAMINE 1 MG/3DAYS TD PT72
MEDICATED_PATCH | TRANSDERMAL | Status: AC
  Filled 2012-03-04: qty 1

## 2012-03-04 MED ORDER — DIPHENHYDRAMINE HCL 25 MG PO CAPS: 25.0000 mg | ORAL_CAPSULE | Freq: Four times a day (QID) | ORAL | Status: DC | PRN

## 2012-03-04 MED ORDER — BISACODYL 5 MG PO TBEC: 5.0000 mg | DELAYED_RELEASE_TABLET | Freq: Every day | ORAL | Status: DC | PRN

## 2012-03-04 MED ORDER — ONDANSETRON HCL 4 MG/2ML IJ SOLN
INTRAMUSCULAR | Status: DC | PRN
  Administered 2012-03-04: 4 mg via INTRAVENOUS

## 2012-03-04 MED ORDER — FLEET ENEMA 7-19 GM/118ML RE ENEM: 1.0000 | ENEMA | Freq: Once | RECTAL | Status: AC | PRN

## 2012-03-04 MED ORDER — CEFAZOLIN SODIUM-DEXTROSE 2-3 GM-% IV SOLR
INTRAVENOUS | Status: AC
  Filled 2012-03-04: qty 50

## 2012-03-04 MED ORDER — SODIUM CHLORIDE 0.9 % IV SOLN
INTRAVENOUS | Status: DC
  Administered 2012-03-04 – 2012-03-05 (×2): via INTRAVENOUS
  Filled 2012-03-04 (×7): qty 1000

## 2012-03-04 MED ORDER — DEXAMETHASONE SODIUM PHOSPHATE 10 MG/ML IJ SOLN
INTRAMUSCULAR | Status: DC | PRN
  Administered 2012-03-04: 10 mg via INTRAVENOUS

## 2012-03-04 MED ORDER — PROMETHAZINE HCL 25 MG/ML IJ SOLN: 6.2500 mg | INTRAMUSCULAR | Status: DC | PRN

## 2012-03-04 MED ORDER — NEOSTIGMINE METHYLSULFATE 1 MG/ML IJ SOLN
INTRAMUSCULAR | Status: DC | PRN
  Administered 2012-03-04: 4 mg via INTRAVENOUS

## 2012-03-04 MED ORDER — METHOCARBAMOL 100 MG/ML IJ SOLN
500.0000 mg | Freq: Four times a day (QID) | INTRAVENOUS | Status: DC | PRN
  Filled 2012-03-04: qty 5

## 2012-03-04 MED ORDER — CEFAZOLIN SODIUM-DEXTROSE 2-3 GM-% IV SOLR
2.0000 g | Freq: Four times a day (QID) | INTRAVENOUS | Status: AC
  Administered 2012-03-04 – 2012-03-05 (×2): 2 g via INTRAVENOUS
  Filled 2012-03-04 (×2): qty 50

## 2012-03-04 MED ORDER — METOPROLOL SUCCINATE ER 50 MG PO TB24
50.0000 mg | ORAL_TABLET | Freq: Three times a day (TID) | ORAL | Status: DC
  Administered 2012-03-05: 50 mg via ORAL
  Filled 2012-03-04 (×5): qty 1

## 2012-03-04 MED ORDER — MUPIROCIN 2 % EX OINT
TOPICAL_OINTMENT | CUTANEOUS | Status: AC
  Filled 2012-03-04: qty 22

## 2012-03-04 MED ORDER — CLONAZEPAM 0.5 MG PO TABS
0.5000 mg | ORAL_TABLET | Freq: Every morning | ORAL | Status: DC
  Administered 2012-03-05: 0.5 mg via ORAL
  Filled 2012-03-04: qty 1

## 2012-03-04 MED ORDER — HYDROMORPHONE HCL PF 1 MG/ML IJ SOLN
INTRAMUSCULAR | Status: DC | PRN
Start: 2012-03-04 — End: 2012-03-04
  Administered 2012-03-04 (×2): .6 mg via INTRAVENOUS
  Administered 2012-03-04 (×2): .4 mg via INTRAVENOUS

## 2012-03-04 MED ORDER — HYDROMORPHONE HCL PF 1 MG/ML IJ SOLN
0.2500 mg | INTRAMUSCULAR | Status: DC | PRN
  Administered 2012-03-04 (×4): 0.5 mg via INTRAVENOUS

## 2012-03-04 MED ORDER — METHOCARBAMOL 500 MG PO TABS
500.0000 mg | ORAL_TABLET | Freq: Four times a day (QID) | ORAL | Status: DC | PRN
  Administered 2012-03-04 – 2012-03-05 (×2): 500 mg via ORAL
  Filled 2012-03-04 (×2): qty 1

## 2012-03-04 MED ORDER — HYDROMORPHONE HCL PF 1 MG/ML IJ SOLN
INTRAMUSCULAR | Status: AC
  Filled 2012-03-04: qty 1

## 2012-03-04 MED ORDER — HYDROCODONE-ACETAMINOPHEN 7.5-325 MG PO TABS
1.0000 | ORAL_TABLET | ORAL | Status: DC | PRN
  Administered 2012-03-04 – 2012-03-05 (×4): 1 via ORAL
  Filled 2012-03-04 (×4): qty 1

## 2012-03-04 MED ORDER — LACTATED RINGERS IV SOLN
INTRAVENOUS | Status: DC | PRN
  Administered 2012-03-04: 13:00:00 via INTRAVENOUS

## 2012-03-04 MED ORDER — PROPOFOL 10 MG/ML IV BOLUS
INTRAVENOUS | Status: DC | PRN
  Administered 2012-03-04: 200 mg via INTRAVENOUS

## 2012-03-04 MED ORDER — 0.9 % SODIUM CHLORIDE (POUR BTL) OPTIME
TOPICAL | Status: DC | PRN
  Administered 2012-03-04: 1000 mL

## 2012-03-04 MED ORDER — ALUM & MAG HYDROXIDE-SIMETH 200-200-20 MG/5ML PO SUSP: 30.0000 mL | ORAL | Status: DC | PRN

## 2012-03-04 MED ORDER — PANTOPRAZOLE SODIUM 40 MG PO TBEC
40.0000 mg | DELAYED_RELEASE_TABLET | Freq: Every day | ORAL | Status: DC
  Administered 2012-03-05: 40 mg via ORAL
  Filled 2012-03-04 (×2): qty 1

## 2012-03-04 MED ORDER — DEXAMETHASONE SODIUM PHOSPHATE 10 MG/ML IJ SOLN
10.0000 mg | Freq: Once | INTRAMUSCULAR | Status: DC
  Filled 2012-03-04: qty 1

## 2012-03-04 MED ORDER — HYDROMORPHONE HCL PF 1 MG/ML IJ SOLN: 0.5000 mg | INTRAMUSCULAR | Status: DC | PRN

## 2012-03-04 MED ORDER — ACETAMINOPHEN 10 MG/ML IV SOLN
INTRAVENOUS | Status: DC | PRN
  Administered 2012-03-04: 1000 mg via INTRAVENOUS

## 2012-03-04 MED ORDER — BUPROPION HCL ER (XL) 300 MG PO TB24
300.0000 mg | ORAL_TABLET | Freq: Every day | ORAL | Status: DC
  Filled 2012-03-04: qty 1

## 2012-03-04 MED ORDER — CEFAZOLIN SODIUM-DEXTROSE 2-3 GM-% IV SOLR
2.0000 g | INTRAVENOUS | Status: AC
  Administered 2012-03-04: 2 g via INTRAVENOUS

## 2012-03-04 MED ORDER — BUPROPION HCL ER (XL) 300 MG PO TB24
300.0000 mg | ORAL_TABLET | Freq: Every day | ORAL | Status: DC
  Administered 2012-03-05: 300 mg via ORAL
  Filled 2012-03-04: qty 1

## 2012-03-04 MED ORDER — ROCURONIUM BROMIDE 100 MG/10ML IV SOLN
INTRAVENOUS | Status: DC | PRN
  Administered 2012-03-04: 2 mg via INTRAVENOUS
  Administered 2012-03-04: 28 mg via INTRAVENOUS

## 2012-03-04 MED ORDER — LIDOCAINE HCL (CARDIAC) 20 MG/ML IV SOLN
INTRAVENOUS | Status: DC | PRN
  Administered 2012-03-04: 75 mg via INTRAVENOUS

## 2012-03-04 MED ORDER — SERTRALINE HCL 100 MG PO TABS
100.0000 mg | ORAL_TABLET | Freq: Every day | ORAL | Status: DC
  Administered 2012-03-05: 100 mg via ORAL
  Filled 2012-03-04: qty 1

## 2012-03-04 MED ORDER — ASPIRIN EC 325 MG PO TBEC
325.0000 mg | DELAYED_RELEASE_TABLET | Freq: Two times a day (BID) | ORAL | Status: DC
  Administered 2012-03-04 – 2012-03-05 (×2): 325 mg via ORAL
  Filled 2012-03-04 (×4): qty 1

## 2012-03-04 MED ORDER — LEVOTHYROXINE SODIUM 150 MCG PO TABS
150.0000 ug | ORAL_TABLET | Freq: Every day | ORAL | Status: DC
  Administered 2012-03-05: 150 ug via ORAL
  Filled 2012-03-04 (×2): qty 1

## 2012-03-04 MED ORDER — FENTANYL CITRATE 0.05 MG/ML IJ SOLN
INTRAMUSCULAR | Status: DC | PRN
  Administered 2012-03-04 (×2): 100 ug via INTRAVENOUS
  Administered 2012-03-04: 50 ug via INTRAVENOUS

## 2012-03-04 MED ORDER — ACETAMINOPHEN 10 MG/ML IV SOLN
INTRAVENOUS | Status: AC
  Filled 2012-03-04: qty 100

## 2012-03-04 MED ORDER — ONDANSETRON HCL 4 MG PO TABS: 4.0000 mg | ORAL_TABLET | Freq: Four times a day (QID) | ORAL | Status: DC | PRN

## 2012-03-04 MED ORDER — ONDANSETRON HCL 4 MG/2ML IJ SOLN
4.0000 mg | Freq: Four times a day (QID) | INTRAMUSCULAR | Status: DC | PRN
  Filled 2012-03-04: qty 2

## 2012-03-04 MED ORDER — SUCCINYLCHOLINE CHLORIDE 20 MG/ML IJ SOLN
INTRAMUSCULAR | Status: DC | PRN
  Administered 2012-03-04: 100 mg via INTRAVENOUS

## 2012-03-04 MED ORDER — CHLORHEXIDINE GLUCONATE 4 % EX LIQD
60.0000 mL | Freq: Once | CUTANEOUS | Status: DC
  Filled 2012-03-04: qty 60

## 2012-03-04 MED ORDER — DOCUSATE SODIUM 100 MG PO CAPS
100.0000 mg | ORAL_CAPSULE | Freq: Two times a day (BID) | ORAL | Status: DC
  Administered 2012-03-04 – 2012-03-05 (×2): 100 mg via ORAL

## 2012-03-04 SURGICAL SUPPLY — 41 items
BAG SPEC THK2 15X12 ZIP CLS (MISCELLANEOUS) ×1
BAG ZIPLOCK 12X15 (MISCELLANEOUS) ×2 IMPLANT
BANDAGE ADHESIVE 1X3 (GAUZE/BANDAGES/DRESSINGS) IMPLANT
BANDAGE ESMARK 6X9 LF (GAUZE/BANDAGES/DRESSINGS) ×1 IMPLANT
BANDAGE GAUZE ELAST BULKY 4 IN (GAUZE/BANDAGES/DRESSINGS) ×2 IMPLANT
BNDG CMPR 9X6 STRL LF SNTH (GAUZE/BANDAGES/DRESSINGS) ×1
BNDG ESMARK 6X9 LF (GAUZE/BANDAGES/DRESSINGS) ×2
CLOTH BEACON ORANGE TIMEOUT ST (SAFETY) ×2 IMPLANT
CUFF TOURN SGL QUICK 18 (TOURNIQUET CUFF) ×2 IMPLANT
CUFF TOURN SGL QUICK 24 (TOURNIQUET CUFF) ×2
CUFF TOURN SGL QUICK 34 (TOURNIQUET CUFF) ×2
CUFF TRNQT CYL 24X4X40X1 (TOURNIQUET CUFF) ×1 IMPLANT
CUFF TRNQT CYL 34X4X40X1 (TOURNIQUET CUFF) ×1 IMPLANT
DRAIN PENROSE 18X1/2 LTX STRL (DRAIN) ×2 IMPLANT
DRSG PAD ABDOMINAL 8X10 ST (GAUZE/BANDAGES/DRESSINGS) ×2 IMPLANT
DURAPREP 26ML APPLICATOR (WOUND CARE) ×2 IMPLANT
ELECT REM PT RETURN 9FT ADLT (ELECTROSURGICAL) ×2
ELECTRODE REM PT RTRN 9FT ADLT (ELECTROSURGICAL) ×1 IMPLANT
GLOVE BIOGEL PI IND STRL 7.5 (GLOVE) ×1 IMPLANT
GLOVE BIOGEL PI IND STRL 8 (GLOVE) ×1 IMPLANT
GLOVE BIOGEL PI INDICATOR 7.5 (GLOVE) ×1
GLOVE BIOGEL PI INDICATOR 8 (GLOVE) ×1
GLOVE ECLIPSE 8.0 STRL XLNG CF (GLOVE) IMPLANT
GLOVE ORTHO TXT STRL SZ7.5 (GLOVE) ×4 IMPLANT
GLOVE SURG ORTHO 8.0 STRL STRW (GLOVE) ×2 IMPLANT
GOWN BRE IMP PREV XXLGXLNG (GOWN DISPOSABLE) ×4 IMPLANT
GOWN STRL NON-REIN LRG LVL3 (GOWN DISPOSABLE) ×2 IMPLANT
HANDPIECE INTERPULSE COAX TIP (DISPOSABLE) ×2
KIT BASIN OR (CUSTOM PROCEDURE TRAY) ×2 IMPLANT
MANIFOLD NEPTUNE II (INSTRUMENTS) ×2 IMPLANT
NDL SAFETY ECLIPSE 18X1.5 (NEEDLE) IMPLANT
NEEDLE HYPO 18GX1.5 SHARP (NEEDLE)
PACK LOWER EXTREMITY WL (CUSTOM PROCEDURE TRAY) ×2 IMPLANT
PAD CAST 4YDX4 CTTN HI CHSV (CAST SUPPLIES) ×1 IMPLANT
PADDING CAST COTTON 4X4 STRL (CAST SUPPLIES) ×2
POSITIONER SURGICAL ARM (MISCELLANEOUS) ×2 IMPLANT
SET HNDPC FAN SPRY TIP SCT (DISPOSABLE) ×1 IMPLANT
SOL PREP PROV IODINE SCRUB 4OZ (MISCELLANEOUS) ×2 IMPLANT
SPONGE GAUZE 4X4 12PLY (GAUZE/BANDAGES/DRESSINGS) ×2 IMPLANT
SYR CONTROL 10ML LL (SYRINGE) ×2 IMPLANT
TOWEL OR 17X26 10 PK STRL BLUE (TOWEL DISPOSABLE) ×4 IMPLANT

## 2012-03-04 NOTE — Transfer of Care (Signed)
Immediate Anesthesia Transfer of Care Note  Patient: Sonia Dawson  Procedure(s) Performed: Procedure(s) (LRB): IRRIGATION AND DEBRIDEMENT KNEE (Left) CLOSED MANIPULATION KNEE (Left)  Patient Location: PACU  Anesthesia Type: General  Level of Consciousness: awake, alert , oriented and patient cooperative  Airway & Oxygen Therapy: Patient Spontanous Breathing and Patient connected to face mask oxygen  Post-op Assessment: Report given to PACU RN and Post -op Vital signs reviewed and stable  Post vital signs: Reviewed and stable  Complications: No apparent anesthesia complications

## 2012-03-04 NOTE — Brief Op Note (Signed)
03/04/2012  3:20 PM  PATIENT:  Sonia Dawson  61 y.o. female  PRE-OPERATIVE DIAGNOSIS: 1. Left Knee persistent surperficial draining wound 2. Post operative stiffness following total knee replacement  POST-OPERATIVE DIAGNOSIS:  left knee persistent superificial draining wound 2. Post operative stiffness following total knee replacement  PROCEDURE:  Procedure(s) (LRB): IRRIGATION AND DEBRIDEMENT KNEE (Left) with primary wound closure.  Incisional/ excisional debridement of skin subcutaneous fat, muscle fascia and tendon, 10cm.  2.  CLOSED MANIPULATION KNEE (Left)  SURGEON:  Surgeon(s) and Role:    * Shelda Pal, MD - Primary  PHYSICIAN ASSISTANT: No  ANESTHESIA:   general  EBL:  Total I/O In: 800 [I.V.:800] Out: -   BLOOD ADMINISTERED:none  DRAINS: none   LOCAL MEDICATIONS USED:  NONE  SPECIMEN:  No Specimen  DISPOSITION OF SPECIMEN:  N/A  COUNTS:  YES  TOURNIQUET:   Total Tourniquet Time Documented: Thigh (Left) - 26 minutes  DICTATION: .Other Dictation: Dictation Number [id number]  PLAN OF CARE: Admit to inpatient   PATIENT DISPOSITION:  PACU - hemodynamically stable.   Delay start of Pharmacological VTE agent (>24hrs) due to surgical blood loss or risk of bleeding: no

## 2012-03-04 NOTE — Anesthesia Postprocedure Evaluation (Signed)
  Anesthesia Post-op Note  Patient: Sonia Dawson  Procedure(s) Performed: Procedure(s) (LRB): IRRIGATION AND DEBRIDEMENT KNEE (Left) CLOSED MANIPULATION KNEE (Left)  Patient Location: PACU  Anesthesia Type: General  Level of Consciousness: awake and alert   Airway and Oxygen Therapy: Patient Spontanous Breathing  Post-op Pain: mild  Post-op Assessment: Post-op Vital signs reviewed, Patient's Cardiovascular Status Stable, Respiratory Function Stable, Patent Airway and No signs of Nausea or vomiting  Post-op Vital Signs: stable  Complications: No apparent anesthesia complications

## 2012-03-04 NOTE — Interval H&P Note (Signed)
History and Physical Interval Note:  03/04/2012 1:42 PM  Sonia Dawson  has presented today for surgery, with the diagnosis of Left Knee persistent surperficial draining wound/ Post op Stiffness  The various methods of treatment have been discussed with the patient and family. After consideration of risks, benefits and other options for treatment, the patient has consented to  Procedure(s) (LRB): IRRIGATION AND DEBRIDEMENT KNEE (Left) CLOSED MANIPULATION KNEE (Left) as a surgical intervention .  The patient's history has been reviewed, patient examined, no change in status, stable for surgery.  I have reviewed the patients' chart and labs.  Questions were answered to the patient's satisfaction.     Shelda Pal

## 2012-03-04 NOTE — Anesthesia Preprocedure Evaluation (Signed)
Anesthesia Evaluation  Patient identified by MRN, date of birth, ID band Patient awake    Reviewed: Allergy & Precautions, H&P , NPO status , Patient's Chart, lab work & pertinent test results, reviewed documented beta blocker date and time   History of Anesthesia Complications (+) PONV  Airway Mallampati: II TM Distance: >3 FB Neck ROM: full    Dental No notable dental hx. (+) Teeth Intact and Dental Advisory Given   Pulmonary neg pulmonary ROS, asthma , sleep apnea , COPD COPD inhaler,  Mild COPD breath sounds clear to auscultation  Pulmonary exam normal       Cardiovascular Exercise Tolerance: Good hypertension, On Home Beta Blockers negative cardio ROS  Rhythm:regular Rate:Normal     Neuro/Psych PSYCHIATRIC DISORDERS Anxiety Depression negative neurological ROS  negative psych ROS   GI/Hepatic negative GI ROS, Neg liver ROS, GERD-  Medicated,  Endo/Other  negative endocrine ROSHypothyroidism Morbid obesity  Renal/GU negative Renal ROS  negative genitourinary   Musculoskeletal   Abdominal   Peds  Hematology negative hematology ROS (+)   Anesthesia Other Findings   Reproductive/Obstetrics negative OB ROS                           Anesthesia Physical Anesthesia Plan  ASA: III  Anesthesia Plan:    Post-op Pain Management:    Induction: Intravenous  Airway Management Planned: Oral ETT  Additional Equipment:   Intra-op Plan:   Post-operative Plan: Extubation in OR  Informed Consent: I have reviewed the patients History and Physical, chart, labs and discussed the procedure including the risks, benefits and alternatives for the proposed anesthesia with the patient or authorized representative who has indicated his/her understanding and acceptance.   Dental advisory given  Plan Discussed with: CRNA  Anesthesia Plan Comments:         Anesthesia Quick Evaluation

## 2012-03-05 ENCOUNTER — Encounter (HOSPITAL_COMMUNITY): Payer: Self-pay | Admitting: Orthopedic Surgery

## 2012-03-05 LAB — BASIC METABOLIC PANEL
CO2: 25 mEq/L (ref 19–32)
Chloride: 102 mEq/L (ref 96–112)
Sodium: 135 mEq/L (ref 135–145)

## 2012-03-05 LAB — CBC
Platelets: 221 10*3/uL (ref 150–400)
RBC: 4.13 MIL/uL (ref 3.87–5.11)
WBC: 4.8 10*3/uL (ref 4.0–10.5)

## 2012-03-05 LAB — HM COLONOSCOPY: HM Colonoscopy: NORMAL

## 2012-03-05 MED ORDER — DIPHENHYDRAMINE HCL 25 MG PO CAPS: 25.0000 mg | ORAL_CAPSULE | Freq: Four times a day (QID) | ORAL | Status: DC | PRN

## 2012-03-05 MED ORDER — POLYETHYLENE GLYCOL 3350 17 G PO PACK: 17.0000 g | PACK | Freq: Two times a day (BID) | ORAL | Status: AC

## 2012-03-05 MED ORDER — DSS 100 MG PO CAPS: 100.0000 mg | ORAL_CAPSULE | Freq: Two times a day (BID) | ORAL | Status: AC

## 2012-03-05 MED ORDER — METHOCARBAMOL 500 MG PO TABS: 500.0000 mg | ORAL_TABLET | Freq: Four times a day (QID) | ORAL | Status: AC | PRN

## 2012-03-05 MED ORDER — HYDROCODONE-ACETAMINOPHEN 7.5-325 MG PO TABS: 1.0000 | ORAL_TABLET | ORAL | Status: AC | PRN

## 2012-03-05 MED ORDER — DOXYCYCLINE HYCLATE 100 MG PO TABS: 100.0000 mg | ORAL_TABLET | Freq: Two times a day (BID) | ORAL | Status: AC

## 2012-03-05 MED ORDER — ASPIRIN 325 MG PO TBEC: 325.0000 mg | DELAYED_RELEASE_TABLET | Freq: Two times a day (BID) | ORAL | Status: AC

## 2012-03-05 NOTE — Op Note (Signed)
NAMECASON, DABNEY NO.:  [id number]  MEDICAL RECORD NO.:  [id number]  LOCATION:  1620                         FACILITY:  [facility]  PHYSICIAN:  Madlyn Frankel. Charlann Boxer, M.D.  DATE OF BIRTH:  27-Jan-1951  DATE OF PROCEDURE:  03/04/2012 DATE OF DISCHARGE:                              OPERATIVE REPORT   PREOPERATIVE DIAGNOSES: 1. Persistent superficial wound drainage in the inferior aspect of the     total knee replacement incision wound. 2. Postoperative stiffness following total knee replacement.  POSTOPERATIVE DIAGNOSES: 1. Persistent superficial wound drainage in the inferior aspect of the     total knee replacement incision wound. 2. Postoperative stiffness following total knee replacement.  PROCEDURES: 1. Incisional/excisional debridement of a left knee wound including     skin, subcutaneous tissue, muscle, fascia, and tendon with     irrigation of 3 L of normal saline solution and subsequent primary     wound closure of approximately 10-cm length incision. 2. Manipulation of left knee under anesthetic.  SURGEON:  Madlyn Frankel. Charlann Boxer, M.D.  ASSISTANT:  Surgical Team.  ANESTHESIA:  General.  SPECIMENS:  None.  COMPLICATION:  None.  DRAINS:  None.  TOURNIQUET TIME:  Approximately 25 minutes at 250 mmHg.  INDICATION FOR PROCEDURE:  Ms. Sonia Dawson is a 61 year old female status post left total knee replacement about 3 months ago by one of my partners. She was sent over for evaluation of persistently draining wound.  She has had problems with this wound area approximately 2 weeks after surgery.  She did have an intraoperative removal of the suture with persistent wound drainage.  Given the duration of this wound drainage, I felt that it was appropriate for her to undergo a formal I and D of this area, identify potential source and attempt to get the skin to heal back on its own primarily.  She had been on antibiotics.  There was no concern for intra- articular knee  infection.  She also reported stiffness sensation in the knee.  She has been limited in her activity since this had been going on.  Reviewed the risks and benefits of the incisional debridement followed by the manipulation.  Consent was obtained for benefit of both.  PROCEDURE IN DETAIL:  The patient was brought to the operative theater. Once adequate anesthesia, preoperative antibiotics, 2 g of Ancef administered, she was positioned supine and left thigh tourniquet was placed.  The left lower extremity was then prepped and draped in sterile fashion. Time-out was performed, identifying the patient, planned procedure, and extremity.  Initially, I did perform this manipulation exam on the knee, I was able to flex her knee back to get her calf touch her hamstring.  There was fairly easy range of motion, consistent with her concerns about generalized stiffness.  Following this, I had marked the skin including the area of the persistent wound drainage, extending the incision slightly proximal and distal to allow for soft tissue dissection as well as debridement of the skin and subcutaneous tissue.  This area was over the anterior aspect of the knee, it ellipsed.  Soft tissue dissection was carried out, creating these planes.  I did  identify in the proximal and medial tibia, an area of hematoma collection which appeared to be in the area of the pes dissection.  There were no signs of infection throughout the knee.  Following the incisional debridement of skin, subcutaneous tissue, as well as debridement of the soft tissues including muscle, fascia, and tendon, I did irrigate this wound with 3 L of normal saline.  Once this was done, I reapproximated this at medial flap tissue which I was able to recreate using #1 Vicryl, reapproximating this area subsequently.  I then used 2-0 Vicryl in the subcu layer and decided to use 2-0 nylon to reapproximate the skin edges to improve the  potential for healing. The wound was then cleaned, dried, and dressed sterilely.  Tourniquet was let down after 25 minutes.  I dressed this wound with Xeroform and a bulky sterile wrap including the Kerlix and a 6-inch Ace wrap.  She was then awoken from anesthetic and brought to the recovery room in stable condition.  She was admitted to the hospital for at least a day for IV antibiotics, will probably go home on oral antibiotics and will follow up with [place] in 2 weeks for wound evaluation.  I would anticipate good healing and success at this, but we will watch it closely given the problems she has had before.  Questions were encouraged.     Madlyn Frankel Charlann Boxer, M.D.     MDO/MEDQ  D:  03/04/2012  T:  03/05/2012  Job:  [id number]

## 2012-03-05 NOTE — Discharge Summary (Signed)
Physician Discharge Summary  Patient ID: Sonia Dawson MRN: [id number] DOB/AGE: Mar 14, 1951 61 y.o.  Admit date: 03/04/2012 Discharge date: 03/05/2012  Procedures:  Procedure(s) (LRB): IRRIGATION AND DEBRIDEMENT KNEE (Left) CLOSED MANIPULATION KNEE (Left)  Attending Physician:  Dr. Durene Dawson   Admission Diagnoses:  Left knee persistent superficial wound drainage  Discharge Diagnoses:  Principal Problem:  *Wound drainage left knee Hypertension   History of DVT   Hypothyroidism   Fatty liver   GERD   Anxiety  Arthritis   Peripheral vascular disease   PONV   Depression   Sleep apnea   HPI:  Pt is a 61 y.o. female complaining of persistent wound drainage since 2 weeks after her left total knee arthroplasty. Left total knee arthroplasty was done on 11/28/2011 by Dr. Darrelyn Dawson. Since that time she has been on multiple doses of antibiotics and in the office under sterile technique Dr. Darrelyn Dawson retrieved some suture that had not dissolved in an attempt allow the wound to heal, which did not help. With the the persistent drainage and delayed healing Dr. Charlann Dawson saw the patient and discussed options. Risks, benefits and expectations were discussed with the patient. Patient understand the risks, benefits and expectations and wishes to proceed with surgery.   PCP: Sonia Linger, Dawson   Discharged Condition: good  Hospital Course:  Patient underwent the above stated procedure on 03/04/2012. Patient tolerated the procedure well and brought to the recovery room in good condition and subsequently to the floor.  POD #1 BP: 147/81 ; Pulse: 58 ; Temp: ; 98 F (36.7 C) ; Resp: 16  Pt's foley was removed, as well as the hemovac drain removed. IV was changed to a saline lock. Patient reports pain as mild, pain well controlled. No events throughout the night. Ready to be discharged home. Neurovascular intact, dorsiflexion/plantar flexion intact, incision: dressing C/D/I, no cellulitis present and  compartment soft.   LABS  Basename  03/05/12 0401   HGB  10.3  HCT  32.1    Discharge Exam: General appearance: alert, cooperative and no distress Extremities: Homans sign is negative, no sign of DVT, no edema, redness or tenderness in the calves or thighs and no ulcers, gangrene or trophic changes  Disposition: Home  with follow up in 2 weeks   Follow-up Information    Follow up with Sonia Dawson. Schedule an appointment as soon as possible for a visit in 2 weeks.   Contact information:   Sonia Dawson [address] [place] [address] [phone]          Discharge Orders    Future Appointments: Provider: Department: Dept Phone: Dawson:   06/03/2012 8:30 AM Sonia Grandchild, [facility] [phone] [facility]     Future Orders Please Complete By Expires   Diet - low sodium heart healthy      Call Dawson / Call 911      Comments:   If you experience chest pain or shortness of breath, CALL 911 and be transported to the hospital emergency room.  If you develope a fever above 101 F, pus (white drainage) or increased drainage or redness at the wound, or calf pain, call your surgeon's office.   Discharge instructions      Comments:   Daily dressing changes with gauze and tape. Keep the area dry and clean until follow up. Follow up in 2 weeks at [facility]. Call with any questions or concerns.   Constipation Prevention  Comments:   Drink plenty of fluids.  Prune juice may be helpful.  You may use a stool softener, such as Colace (over the counter) 100 mg twice a day.  Use MiraLax (over the counter) for constipation as needed.   Increase activity slowly as tolerated      Driving restrictions      Comments:   No driving for 4 weeks   Change dressing      Comments:   Daily dressing changes with sterile 4 x 4 inch gauze dressing and tape. Keep the area dry and clean.      Current Discharge Medication List    START  taking these medications   Details  diphenhydrAMINE (BENADRYL) 25 mg capsule Take 1 capsule (25 mg total) by mouth every 6 (six) hours as needed for itching, allergies or sleep. Qty: 30 capsule    docusate sodium 100 MG CAPS Take 100 mg by mouth 2 (two) times daily. Qty: 10 capsule    doxycycline (VIBRA-TABS) 100 MG tablet Take 1 tablet (100 mg total) by mouth 2 (two) times daily. Qty: 40 tablet, Refills: 0    HYDROcodone-acetaminophen (NORCO) 7.5-325 MG per tablet Take 1-2 tablets by mouth every 4 (four) hours as needed (breakthrough pain). Qty: 120 tablet, Refills: 0    polyethylene glycol (MIRALAX / GLYCOLAX) packet Take 17 g by mouth 2 (two) times daily. Qty: 14 each      CONTINUE these medications which have CHANGED   Details  aspirin EC 325 MG EC tablet Take 1 tablet (325 mg total) by mouth 2 (two) times daily. Qty: 30 tablet   Comments: For 4 weeks    methocarbamol (ROBAXIN) 500 MG tablet Take 1 tablet (500 mg total) by mouth every 6 (six) hours as needed (muscle spasms). Qty: 50 tablet, Refills: 0      CONTINUE these medications which have NOT CHANGED   Details  buPROPion (WELLBUTRIN XL) 300 MG 24 hr tablet Take 300 mg by mouth daily with breakfast.     clonazePAM (KLONOPIN) 0.5 MG tablet Take 0.5 mg by mouth every morning.    levothyroxine (SYNTHROID) 150 MCG tablet Take 150 mcg by mouth daily with breakfast.     metoprolol succinate (TOPROL-XL) 50 MG 24 hr tablet Take 50 mg by mouth 3 (three) times daily. Verified with patient that she is taking 3 times daily    pantoprazole (PROTONIX) 40 MG tablet Take 40 mg by mouth daily.     sertraline (ZOLOFT) 100 MG tablet Take 1 tablet (100 mg total) by mouth daily with breakfast. Qty: 30 tablet, Refills: 5      STOP taking these medications     HYDROcodone-acetaminophen (NORCO) 5-325 MG per tablet Comments:  Reason for Stopping:           Signed: Anastasio Auerbach. Frieda Arnall   PAC  03/05/2012, 9:09 AM

## 2012-03-05 NOTE — Progress Notes (Signed)
  Subjective: 1 Day Post-Op Procedure(s) (LRB): IRRIGATION AND DEBRIDEMENT KNEE (Left) CLOSED MANIPULATION KNEE (Left)   Patient reports pain as mild, pain well controlled. No events throughout the night. Ready to be discharged home.  Objective:   VITALS:   Filed Vitals:   03/05/12 0503  BP: 147/81  Pulse: 58  Temp: 98 F (36.7 C)  Resp: 16    Neurovascular intact Dorsiflexion/Plantar flexion intact Incision: dressing C/D/I No cellulitis present Compartment soft  LABS  Basename 03/05/12 0401 03/04/12 1225  HGB 10.3* 10.4*  HCT 32.1* 32.3*  WBC 4.8 4.7  PLT 221 232     Basename 03/05/12 0401 03/04/12 1225  NA 135 137  K 4.8 4.0  BUN 11 11  CREATININE 0.85 0.86  GLUCOSE 150* 96     Assessment/Plan: 1 Day Post-Op Procedure(s) (LRB): IRRIGATION AND DEBRIDEMENT KNEE (Left) CLOSED MANIPULATION KNEE (Left)   Advance diet Up with therapy D/C IV fluids Discharge home today Follow up in 2 weeks at [facility].  Follow-up Information    Follow up with OLIN,Tre Sanker D in 2 weeks.   Contact information:   [facility] [address] [place] [address] [phone]             Anastasio Auerbach. Markevius Trombetta   PAC  03/05/2012, 8:56 AM

## 2012-03-05 NOTE — Evaluation (Signed)
Physical Therapy Evaluation Patient Details Name: Sonia Dawson MRN: [id number] DOB: [birth year] Today's Date: 03/05/2012 Time: 4098-1191 PT Time Calculation (min): 31 min  PT Assessment / Plan / Recommendation Clinical Impression  Pt with L TKR manipulation and I&D presents with limitations in L LE ROM/strength and functional mobility    PT Assessment  All further PT needs can be met in the next venue of care    Follow Up Recommendations  Outpatient PT    Barriers to Discharge        Equipment Recommendations  None recommended by PT    Recommendations for Other Services     Frequency      Precautions / Restrictions Precautions Precautions: Knee Restrictions Weight Bearing Restrictions: No   Pertinent Vitals/Pain 1-2/10; pt premedicated.  Ice packs provided      Mobility  Bed Mobility Bed Mobility: Supine to Sit Supine to Sit: 5: Supervision Transfers Transfers: Sit to Stand;Stand to Sit Sit to Stand: 5: Supervision Stand to Sit: 5: Supervision Details for Transfer Assistance: cues for use of UEs Ambulation/Gait Ambulation/Gait Assistance: 4: Min guard Ambulation Distance (Feet): 150 Feet Assistive device: Rolling walker Ambulation/Gait Assistance Details: min cues for progression to recip gait Gait Pattern: Step-to pattern;Step-through pattern    Exercises Total Joint Exercises Ankle Circles/Pumps: AROM;15 reps;Supine;Both Quad Sets: Both;10 reps;Supine;AROM Heel Slides: AAROM;15 reps;Supine;Left Straight Leg Raises: AROM;15 reps;Left;Supine   PT Diagnosis: Difficulty walking  PT Problem List: Decreased strength;Decreased range of motion;Decreased activity tolerance;Pain;Obesity PT Treatment Interventions:     PT Goals Acute Rehab PT Goals PT Goal Formulation: With patient Time For Goal Achievement: 03/05/12 Potential to Achieve Goals: Good Pt will go Sit to Stand: with modified independence PT Goal: Sit to Stand - Progress: Goal set today Pt will go  Stand to Sit: with modified independence PT Goal: Stand to Sit - Progress: Goal set today Pt will Ambulate: >150 feet;with supervision;with cane PT Goal: Ambulate - Progress: Goal set today  Visit Information  Last PT Received On: 03/05/12 Assistance Needed: +1    Subjective Data  Subjective: I was using a cane again before I came in for surgery Patient Stated Goal: Rehab knee as well as R one   Prior Functioning  Home Living Lives With: Family Available Help at Discharge: Family Type of Home: House Home Access: Ramped entrance Home Layout: One level Home Adaptive Equipment: Environmental consultant - rolling;Straight cane Prior Function Level of Independence: Independent with assistive device(s) Able to Take Stairs?: Yes Driving: Yes Communication Communication: No difficulties    Cognition  Overall Cognitive Status: Appears within functional limits for tasks assessed/performed Arousal/Alertness: Awake/alert Orientation Level: Appears intact for tasks assessed Behavior During Session: [facility] for tasks performed    Extremity/Trunk Assessment Right Upper Extremity Assessment RUE ROM/Strength/Tone: [facility] for tasks assessed Left Upper Extremity Assessment LUE ROM/Strength/Tone: WFL for tasks assessed Right Lower Extremity Assessment RLE ROM/Strength/Tone: [facility] for tasks assessed Left Lower Extremity Assessment LLE ROM/Strength/Tone: Deficits LLE ROM/Strength/Tone Deficits: strength 3+/5 with AAROM 0 - 95   Balance    End of Session PT - End of Session Activity Tolerance: Patient tolerated treatment well Patient left: in chair;with call bell/phone within reach Nurse Communication: Mobility status  GP     Jaylin Roundy 03/05/2012, 11:42 AM

## 2012-03-05 NOTE — Progress Notes (Signed)
Physical Therapy Treatment Patient Details Name: Sonia Dawson MRN: [id number] DOB: Sep 10, 1950 Today's Date: 03/05/2012 Time: 1005-1019 PT Time Calculation (min): 14 min  PT Assessment / Plan / Recommendation Comments on Treatment Session       Follow Up Recommendations  Outpatient PT    Barriers to Discharge        Equipment Recommendations  None recommended by PT    Recommendations for Other Services    Frequency     Plan Discharge plan remains appropriate    Precautions / Restrictions Precautions Precautions: Knee Restrictions Weight Bearing Restrictions: No   Pertinent Vitals/Pain 1/10    Mobility  Bed Mobility Bed Mobility: Supine to Sit Supine to Sit: 5: Supervision Transfers Transfers: Sit to Stand;Stand to Sit Sit to Stand: 6: Modified independent (Device/Increase time) Stand to Sit: 6: Modified independent (Device/Increase time) Details for Transfer Assistance: cues for use of UEs Ambulation/Gait Ambulation/Gait Assistance: 4: Min guard;5: Supervision Ambulation Distance (Feet): 240 Feet Assistive device: Straight cane Ambulation/Gait Assistance Details: min cues for posture Gait Pattern: Step-to pattern;Step-through pattern    Exercises Total Joint Exercises Ankle Circles/Pumps: AROM;15 reps;Supine;Both Quad Sets: Both;10 reps;Supine;AROM Heel Slides: AAROM;15 reps;Supine;Left Straight Leg Raises: AROM;15 reps;Left;Supine   PT Diagnosis: Difficulty walking  PT Problem List: Decreased strength;Decreased range of motion;Decreased activity tolerance;Pain;Obesity PT Treatment Interventions:     PT Goals Acute Rehab PT Goals PT Goal Formulation: With patient Time For Goal Achievement: 03/05/12 Potential to Achieve Goals: Good Pt will go Sit to Stand: with modified independence PT Goal: Sit to Stand - Progress: Met Pt will go Stand to Sit: with modified independence PT Goal: Stand to Sit - Progress: Met Pt will Ambulate: >150 feet;with  supervision;with cane PT Goal: Ambulate - Progress: Met  Visit Information  Last PT Received On: 03/05/12 Assistance Needed: +1    Subjective Data  Subjective: I think my leg feels better than before surgery Patient Stated Goal: Rehab knee as well as R one   Cognition  Overall Cognitive Status: Appears within functional limits for tasks assessed/performed Arousal/Alertness: Awake/alert Orientation Level: Appears intact for tasks assessed Behavior During Session: [facility] for tasks performed    Balance     End of Session PT - End of Session Activity Tolerance: Patient tolerated treatment well Patient left: in chair;with call bell/phone within reach Nurse Communication: Mobility status   GP     Sonia Dawson 03/05/2012, 11:48 AM

## 2012-03-05 NOTE — Progress Notes (Signed)
Occupational Therapy Note Chart reviewed. Spoke with patient and she doesn't feel she has any OT needs. She has all DME and assist available at discharge. Will sign off. Judithann Sauger OTR/L [phone] 03/05/2012

## 2012-03-05 NOTE — Care Management Note (Signed)
    Page 1 of 2   03/05/2012     12:13:32 PM   CARE MANAGEMENT NOTE 03/05/2012  Patient:  Sonia Dawson, Sonia Dawson   Account Number:  [id number]  Date Initiated:  03/05/2012  Documentation initiated by:  Colleen Can  Subjective/Objective Assessment:   DX  wound drainage s/p knee replacemnt; incisiona/excisional debridement left knee wound     Action/Plan:   CM spoke with patient and daughter. Plans are for patient to return to her home where daughter will be caregiver. Already has DME. Plans are for patient to have op physical therapy as set up by doctor's office   Anticipated DC Date:  03/05/2012   Anticipated DC Plan:  HOME/SELF CARE  In-house referral  NA      DC Planning Services  CM consult      PAC Choice  NA   Choice offered to / List presented to:  NA   DME arranged  NA      DME agency  NA     HH arranged  NA      HH agency  NA   Status of service:  Completed, signed off Medicare Important Message given?  NO (If response is "NO", the following Medicare IM given date fields will be blank) Date Medicare IM given:   Date Additional Medicare IM given:    Discharge Disposition:  HOME/SELF CARE  Per UR Regulation:  Reviewed for med. necessity/level of care/duration of stay  If discussed at Long Length of Stay Meetings, dates discussed:    Comments:

## 2012-03-07 ENCOUNTER — Encounter: Payer: Self-pay | Admitting: Vascular Surgery

## 2012-03-17 ENCOUNTER — Encounter: Payer: Self-pay | Admitting: Internal Medicine

## 2012-03-17 ENCOUNTER — Ambulatory Visit (INDEPENDENT_AMBULATORY_CARE_PROVIDER_SITE_OTHER): Payer: BC Managed Care – PPO | Admitting: Internal Medicine

## 2012-03-17 VITALS — BP 140/96 | HR 75 | Temp 98.3°F | Resp 16 | Wt 255.5 lb

## 2012-03-17 DIAGNOSIS — E039 Hypothyroidism, unspecified: Secondary | ICD-10-CM

## 2012-03-17 DIAGNOSIS — F419 Anxiety disorder, unspecified: Secondary | ICD-10-CM

## 2012-03-17 DIAGNOSIS — D509 Iron deficiency anemia, unspecified: Secondary | ICD-10-CM

## 2012-03-17 DIAGNOSIS — Z1231 Encounter for screening mammogram for malignant neoplasm of breast: Secondary | ICD-10-CM

## 2012-03-17 DIAGNOSIS — Z23 Encounter for immunization: Secondary | ICD-10-CM

## 2012-03-17 DIAGNOSIS — I10 Essential (primary) hypertension: Secondary | ICD-10-CM

## 2012-03-17 DIAGNOSIS — F411 Generalized anxiety disorder: Secondary | ICD-10-CM

## 2012-03-17 MED ORDER — PANTOPRAZOLE SODIUM 40 MG PO TBEC: 40.0000 mg | DELAYED_RELEASE_TABLET | Freq: Every day | ORAL | Status: AC

## 2012-03-17 MED ORDER — LEVOTHYROXINE SODIUM 150 MCG PO TABS: 150.0000 ug | ORAL_TABLET | Freq: Every day | ORAL | Status: DC

## 2012-03-17 MED ORDER — SERTRALINE HCL 100 MG PO TABS: 100.0000 mg | ORAL_TABLET | Freq: Every day | ORAL | Status: DC

## 2012-03-17 MED ORDER — CLONAZEPAM 0.5 MG PO TABS: 0.5000 mg | ORAL_TABLET | Freq: Two times a day (BID) | ORAL | Status: DC | PRN

## 2012-03-17 MED ORDER — METOPROLOL SUCCINATE ER 50 MG PO TB24: 50.0000 mg | ORAL_TABLET | Freq: Three times a day (TID) | ORAL | Status: DC

## 2012-03-17 NOTE — Assessment & Plan Note (Signed)
Her BP is not well controlled, she will restart HCTZ

## 2012-03-17 NOTE — Assessment & Plan Note (Signed)
Her recent TSH was normal 

## 2012-03-17 NOTE — Patient Instructions (Signed)

## 2012-03-17 NOTE — Progress Notes (Signed)
  Subjective:    Patient ID: Sonia Dawson, female    DOB: 07-04-1951, 62 y.o.   MRN: [id number]  Hypertension This is a chronic problem. The current episode started more than 1 year ago. The problem has been gradually worsening since onset. The problem is uncontrolled. Associated symptoms include anxiety. Pertinent negatives include no blurred vision, chest pain, headaches, malaise/fatigue, neck pain, orthopnea, palpitations, peripheral edema, PND, shortness of breath or sweats. Past treatments include beta blockers. The current treatment provides moderate improvement. Compliance problems include exercise and diet.  Hypertensive end-organ damage includes a thyroid problem.      Review of Systems  Constitutional: Negative.  Negative for malaise/fatigue.  HENT: Negative.  Negative for neck pain.   Eyes: Negative.  Negative for blurred vision.  Respiratory: Negative for cough, chest tightness, shortness of breath, wheezing and stridor.   Cardiovascular: Negative for chest pain, palpitations, orthopnea, leg swelling and PND.  Gastrointestinal: Negative for nausea, vomiting, abdominal pain, diarrhea, constipation and blood in stool.  Genitourinary: Negative.   Musculoskeletal: Positive for arthralgias. Negative for myalgias, back pain, joint swelling and gait problem.  Skin: Negative for color change, pallor, rash and wound.  Neurological: Negative.  Negative for dizziness, tremors, seizures, syncope, facial asymmetry, speech difficulty, weakness, light-headedness, numbness and headaches.  Hematological: Negative for adenopathy. Does not bruise/bleed easily.  Psychiatric/Behavioral: Positive for disturbed wake/sleep cycle. Negative for suicidal ideas, hallucinations, behavioral problems, confusion, self-injury, dysphoric mood, decreased concentration and agitation. The patient is nervous/anxious. The patient is not hyperactive.        Objective:   Physical Exam  Vitals reviewed. Constitutional:  She is oriented to person, place, and time. She appears well-developed and well-nourished. No distress.  HENT:  Head: Normocephalic and atraumatic.  Mouth/Throat: Oropharynx is clear and moist. No oropharyngeal exudate.  Eyes: Conjunctivae are normal. Right eye exhibits no discharge. Left eye exhibits no discharge. No scleral icterus.  Neck: Normal range of motion. Neck supple. No JVD present. No tracheal deviation present. No thyromegaly present.  Cardiovascular: Normal rate, regular rhythm, normal heart sounds and intact distal pulses.  Exam reveals no gallop and no friction rub.   No murmur heard. Pulmonary/Chest: Effort normal and breath sounds normal. No stridor. No respiratory distress. She has no wheezes. She has no rales. She exhibits no tenderness.  Abdominal: Soft. Bowel sounds are normal. She exhibits no distension and no mass. There is no tenderness. There is no rebound and no guarding.  Musculoskeletal: Normal range of motion. She exhibits no edema and no tenderness.  Lymphadenopathy:    She has no cervical adenopathy.  Neurological: She is oriented to person, place, and time.  Skin: Skin is warm and dry. No rash noted. She is not diaphoretic. No erythema. No pallor.  Psychiatric: She has a normal mood and affect. Her behavior is normal. Judgment and thought content normal.     Lab Results  Component Value Date   WBC 4.8 03/05/2012   HGB 10.3* 03/05/2012   HCT 32.1* 03/05/2012   PLT 221 03/05/2012   GLUCOSE 150* 03/05/2012   ALT 14 02/01/2012   AST 24 02/01/2012   NA 135 03/05/2012   K 4.8 03/05/2012   CL 102 03/05/2012   CREATININE 0.85 03/05/2012   BUN 11 03/05/2012   CO2 25 03/05/2012   TSH 3.63 02/01/2012   INR 1.01 03/04/2012   HGBA1C 5.9 02/01/2012       Assessment & Plan:

## 2012-03-17 NOTE — Assessment & Plan Note (Signed)
She will continue the current meds with no changes 

## 2012-03-20 ENCOUNTER — Telehealth: Payer: Self-pay | Admitting: *Deleted

## 2012-03-20 NOTE — Telephone Encounter (Signed)
Sonia Dawson at Pharmacy left vm requesting verification on Metoprolol succ 50 mg ER. He states this is a 24 hr form and wants to clarify if the sig and quantity are correct. I advised him Dr. Yetta Barre is out of office until Monday 03/24/12. He is going to put Rx on hold and wait to hear back from our office as to fill or not.   Also, pt has Rx on file there from [facility] for Metoprolol succ 100 mg 1 qd.

## 2012-03-23 NOTE — Telephone Encounter (Signed)
Ask the pt to clarify what she has been taking

## 2012-03-24 NOTE — Telephone Encounter (Signed)
Pt states she has been taking this med tid ever since it was changed from 100 mg. Pharmacy informed

## 2012-05-14 ENCOUNTER — Ambulatory Visit (HOSPITAL_COMMUNITY): Payer: BC Managed Care – PPO | Attending: Internal Medicine

## 2012-05-19 ENCOUNTER — Ambulatory Visit: Payer: BC Managed Care – PPO | Admitting: Internal Medicine

## 2012-06-03 ENCOUNTER — Ambulatory Visit: Payer: BC Managed Care – PPO | Admitting: Internal Medicine

## 2012-06-17 ENCOUNTER — Ambulatory Visit: Payer: BC Managed Care – PPO | Admitting: Internal Medicine

## 2012-07-08 ENCOUNTER — Other Ambulatory Visit: Payer: Self-pay

## 2012-07-08 MED ORDER — BUPROPION HCL ER (XL) 300 MG PO TB24: 300.0000 mg | ORAL_TABLET | Freq: Every day | ORAL | Status: DC

## 2012-07-10 ENCOUNTER — Ambulatory Visit (INDEPENDENT_AMBULATORY_CARE_PROVIDER_SITE_OTHER): Payer: BC Managed Care – PPO | Admitting: Internal Medicine

## 2012-07-10 ENCOUNTER — Other Ambulatory Visit (INDEPENDENT_AMBULATORY_CARE_PROVIDER_SITE_OTHER): Payer: BC Managed Care – PPO

## 2012-07-10 ENCOUNTER — Encounter: Payer: Self-pay | Admitting: Internal Medicine

## 2012-07-10 VITALS — BP 138/88 | HR 72 | Temp 98.1°F | Resp 18 | Wt 259.1 lb

## 2012-07-10 DIAGNOSIS — E039 Hypothyroidism, unspecified: Secondary | ICD-10-CM

## 2012-07-10 DIAGNOSIS — D509 Iron deficiency anemia, unspecified: Secondary | ICD-10-CM

## 2012-07-10 DIAGNOSIS — I10 Essential (primary) hypertension: Secondary | ICD-10-CM

## 2012-07-10 DIAGNOSIS — M159 Polyosteoarthritis, unspecified: Secondary | ICD-10-CM

## 2012-07-10 LAB — CBC WITH DIFFERENTIAL/PLATELET
Eosinophils Relative: 2.5 % (ref 0.0–5.0)
HCT: 36.3 % (ref 36.0–46.0)
Lymphs Abs: 2.5 10*3/uL (ref 0.7–4.0)
MCHC: 31.9 g/dL (ref 30.0–36.0)
MCV: 75.7 fl — ABNORMAL LOW (ref 78.0–100.0)
Monocytes Absolute: 0.5 10*3/uL (ref 0.1–1.0)
Platelets: 243 10*3/uL (ref 150.0–400.0)
RDW: 16.6 % — ABNORMAL HIGH (ref 11.5–14.6)
WBC: 6.4 10*3/uL (ref 4.5–10.5)

## 2012-07-10 LAB — BASIC METABOLIC PANEL
BUN: 14 mg/dL (ref 6–23)
CO2: 29 mEq/L (ref 19–32)
Chloride: 102 mEq/L (ref 96–112)
Creatinine, Ser: 1.1 mg/dL (ref 0.4–1.2)
Glucose, Bld: 106 mg/dL — ABNORMAL HIGH (ref 70–99)

## 2012-07-10 LAB — IBC PANEL: Iron: 54 ug/dL (ref 42–145)

## 2012-07-10 LAB — FERRITIN: Ferritin: 12.1 ng/mL (ref 10.0–291.0)

## 2012-07-10 MED ORDER — HYDROCODONE-ACETAMINOPHEN 7.5-325 MG PO TABS: 1.0000 | ORAL_TABLET | Freq: Three times a day (TID) | ORAL | Status: DC | PRN

## 2012-07-10 NOTE — Progress Notes (Signed)
Subjective:    Patient ID: Sonia Dawson, female    DOB: 1951/08/16, 61 y.o.   MRN: [id number]  Hypertension This is a chronic problem. The current episode started more than 1 year ago. The problem is unchanged. The problem is controlled. Associated symptoms include anxiety. Pertinent negatives include no blurred vision, chest pain, headaches, malaise/fatigue, neck pain, orthopnea, palpitations, peripheral edema, PND, shortness of breath or sweats. Risk factors for coronary artery disease include obesity. Past treatments include diuretics and beta blockers. The current treatment provides moderate improvement. Compliance problems include exercise and diet.  Hypertensive end-organ damage includes a thyroid problem.      Review of Systems  Constitutional: Positive for fatigue. Negative for fever, chills, malaise/fatigue, diaphoresis, activity change, appetite change and unexpected weight change.  HENT: Negative.  Negative for neck pain.   Eyes: Negative.  Negative for blurred vision.  Respiratory: Negative for cough, chest tightness, shortness of breath, wheezing and stridor.   Cardiovascular: Negative for chest pain, palpitations, orthopnea, leg swelling and PND.  Gastrointestinal: Negative for nausea, vomiting, abdominal pain, diarrhea, constipation and anal bleeding.  Genitourinary: Negative.   Musculoskeletal: Positive for arthralgias (both knees). Negative for myalgias, back pain, joint swelling and gait problem.  Skin: Negative.   Neurological: Negative for dizziness, tremors, seizures, syncope, facial asymmetry, speech difficulty, weakness, light-headedness, numbness and headaches.  Hematological: Negative for adenopathy. Does not bruise/bleed easily.  Psychiatric/Behavioral: Positive for dysphoric mood. Negative for suicidal ideas, hallucinations, behavioral problems, confusion, sleep disturbance, self-injury, decreased concentration and agitation. The patient is nervous/anxious. The  patient is not hyperactive.        Objective:   Physical Exam  Vitals reviewed. Constitutional: She is oriented to person, place, and time. She appears well-developed and well-nourished. No distress.  HENT:  Head: Normocephalic and atraumatic.  Mouth/Throat: Oropharynx is clear and moist. No oropharyngeal exudate.  Eyes: Conjunctivae normal are normal. Right eye exhibits no discharge. Left eye exhibits no discharge. No scleral icterus.  Neck: Normal range of motion. Neck supple. No JVD present. No tracheal deviation present. No thyromegaly present.  Cardiovascular: Normal rate, regular rhythm, normal heart sounds and intact distal pulses.  Exam reveals no gallop and no friction rub.   No murmur heard. Pulmonary/Chest: Effort normal and breath sounds normal. No stridor. No respiratory distress. She has no wheezes. She has no rales. She exhibits no tenderness.  Abdominal: Soft. Bowel sounds are normal. She exhibits no distension and no mass. There is no tenderness. There is no rebound and no guarding.  Musculoskeletal: Normal range of motion. She exhibits no edema and no tenderness.  Lymphadenopathy:    She has no cervical adenopathy.  Neurological: She is oriented to person, place, and time.  Skin: Skin is warm and dry. No rash noted. She is not diaphoretic. No erythema. No pallor.  Psychiatric: Her speech is normal and behavior is normal. Judgment and thought content normal. Her mood appears not anxious. Her affect is not angry, not blunt, not labile and not inappropriate. Cognition and memory are normal. She exhibits a depressed mood (tearful).     Lab Results  Component Value Date   WBC 4.8 03/05/2012   HGB 10.3* 03/05/2012   HCT 32.1* 03/05/2012   PLT 221 03/05/2012   GLUCOSE 150* 03/05/2012   ALT 14 02/01/2012   AST 24 02/01/2012   NA 135 03/05/2012   K 4.8 03/05/2012   CL 102 03/05/2012   CREATININE 0.85 03/05/2012   BUN 11 03/05/2012   CO2 25  03/05/2012   TSH 3.63 02/01/2012   INR  1.01 03/04/2012   HGBA1C 5.9 02/01/2012       Assessment & Plan:

## 2012-07-10 NOTE — Patient Instructions (Signed)

## 2012-07-11 ENCOUNTER — Encounter: Payer: Self-pay | Admitting: Internal Medicine

## 2012-07-11 NOTE — Assessment & Plan Note (Signed)
I will check her TSH today and will adjust her dose if needed 

## 2012-07-11 NOTE — Assessment & Plan Note (Signed)
Continue current meds for pain 

## 2012-07-11 NOTE — Assessment & Plan Note (Signed)
I will recheck her CBC and iron levels today 

## 2012-07-11 NOTE — Assessment & Plan Note (Signed)
Her BP is well controlled, I will check her lytes and renal function 

## 2012-10-05 ENCOUNTER — Other Ambulatory Visit: Payer: Self-pay | Admitting: Internal Medicine

## 2012-10-06 ENCOUNTER — Other Ambulatory Visit: Payer: Self-pay

## 2012-10-06 DIAGNOSIS — F419 Anxiety disorder, unspecified: Secondary | ICD-10-CM

## 2012-10-06 MED ORDER — CLONAZEPAM 0.5 MG PO TABS: 0.5000 mg | ORAL_TABLET | Freq: Two times a day (BID) | ORAL | Status: DC | PRN

## 2012-11-07 ENCOUNTER — Ambulatory Visit: Payer: BC Managed Care – PPO | Admitting: Internal Medicine

## 2013-01-05 ENCOUNTER — Other Ambulatory Visit: Payer: Self-pay | Admitting: Internal Medicine

## 2013-02-10 ENCOUNTER — Other Ambulatory Visit: Payer: Self-pay | Admitting: Radiology

## 2013-10-29 IMAGING — CR DG KNEE 1-2V PORT*L*
1 series · 2 of 2 positions shown · non-contrast
Comparison: 10/12/2010

CLINICAL DATA: Postop

PORTABLE LEFT KNEE - 1-2 VIEW

[Series 1: AP · left · 2 of 2 slices shown]
[im 1/2]
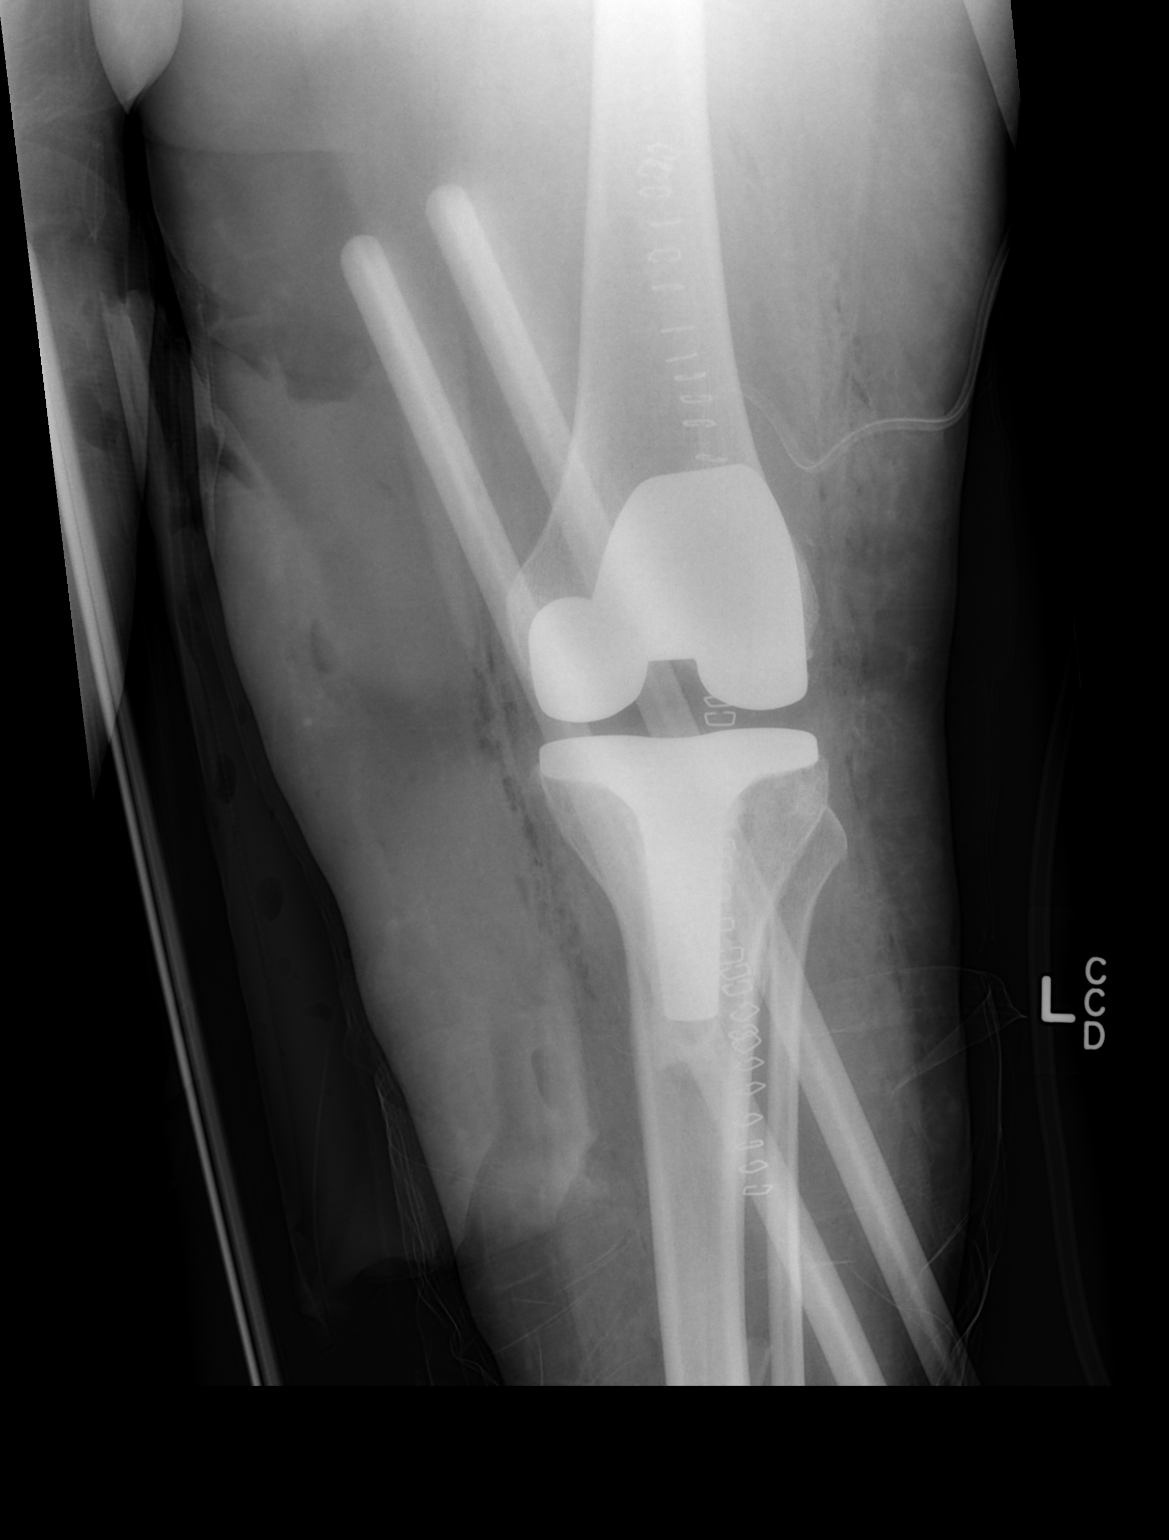
[im 2/2]
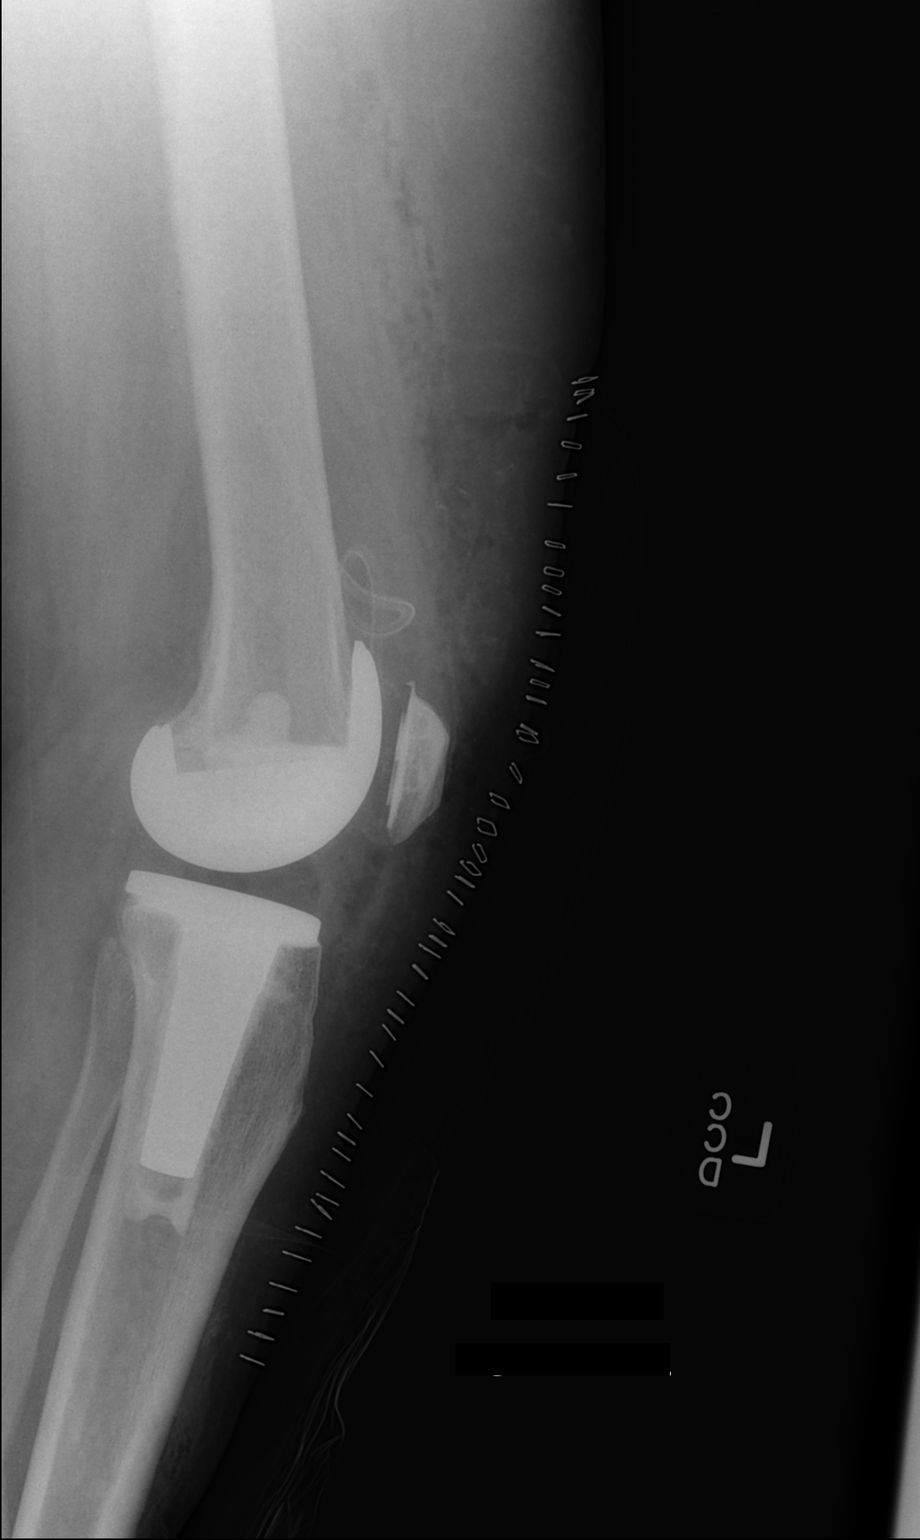

[2 of 2 positions shown; findings below may reference images not displayed]

FINDINGS: Two views of the left knee submitted.  There is a left
knee prosthesis in anatomic alignment.  Postsurgical changes are
noted with surgical drain and midline skin staples.  Small amount
of periarticular soft tissue air.
IMPRESSION: Left knee prosthesis in anatomic alignment. Postsurgical changes
are noted.

## 2018-05-17 ENCOUNTER — Ambulatory Visit (INDEPENDENT_AMBULATORY_CARE_PROVIDER_SITE_OTHER): Payer: Medicare HMO | Admitting: Psychiatry

## 2018-05-17 ENCOUNTER — Encounter (HOSPITAL_COMMUNITY): Payer: Self-pay | Admitting: Psychiatry

## 2018-05-17 ENCOUNTER — Encounter (INDEPENDENT_AMBULATORY_CARE_PROVIDER_SITE_OTHER): Payer: Self-pay

## 2018-05-17 VITALS — BP 170/75 | HR 64 | Ht 66.0 in | Wt 261.0 lb

## 2018-05-17 DIAGNOSIS — F331 Major depressive disorder, recurrent, moderate: Secondary | ICD-10-CM

## 2018-05-17 DIAGNOSIS — F419 Anxiety disorder, unspecified: Secondary | ICD-10-CM

## 2018-05-17 DIAGNOSIS — F17210 Nicotine dependence, cigarettes, uncomplicated: Secondary | ICD-10-CM | POA: Diagnosis not present

## 2018-05-17 DIAGNOSIS — G4700 Insomnia, unspecified: Secondary | ICD-10-CM

## 2018-05-17 MED ORDER — CLONAZEPAM 0.5 MG PO TABS: 0.5000 mg | ORAL_TABLET | Freq: Two times a day (BID) | ORAL | 2 refills | Status: DC | PRN

## 2018-05-17 MED ORDER — DULOXETINE HCL 30 MG PO CPEP: 30.0000 mg | ORAL_CAPSULE | Freq: Every day | ORAL | 2 refills | Status: DC

## 2018-05-17 MED ORDER — BUPROPION HCL ER (XL) 300 MG PO TB24: ORAL_TABLET | ORAL | 4 refills | Status: AC
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
## 2018-06-08 ENCOUNTER — Other Ambulatory Visit (HOSPITAL_COMMUNITY): Payer: Self-pay | Admitting: Psychiatry

## 2018-06-17 ENCOUNTER — Ambulatory Visit (HOSPITAL_COMMUNITY): Payer: Medicare HMO | Admitting: Psychiatry

## 2018-06-17 ENCOUNTER — Encounter (HOSPITAL_COMMUNITY): Payer: Self-pay | Admitting: Psychiatry

## 2018-06-17 VITALS — BP 132/78 | HR 63 | Ht 64.0 in | Wt 258.0 lb

## 2018-06-17 DIAGNOSIS — F411 Generalized anxiety disorder: Secondary | ICD-10-CM

## 2018-06-17 DIAGNOSIS — F331 Major depressive disorder, recurrent, moderate: Secondary | ICD-10-CM | POA: Diagnosis not present

## 2018-06-17 MED ORDER — DULOXETINE HCL 30 MG PO CPEP: ORAL_CAPSULE | ORAL | 1 refills | Status: AC
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
## 2018-06-30 ENCOUNTER — Ambulatory Visit (HOSPITAL_COMMUNITY): Payer: Medicare HMO | Admitting: Licensed Clinical Social Worker

## 2018-07-08 ENCOUNTER — Ambulatory Visit (HOSPITAL_COMMUNITY): Payer: Medicare HMO | Admitting: Licensed Clinical Social Worker

## 2018-07-08 DIAGNOSIS — F331 Major depressive disorder, recurrent, moderate: Secondary | ICD-10-CM | POA: Diagnosis not present
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
## 2018-07-23 ENCOUNTER — Ambulatory Visit (HOSPITAL_COMMUNITY): Payer: Medicare HMO | Admitting: Licensed Clinical Social Worker

## 2018-07-23 DIAGNOSIS — F331 Major depressive disorder, recurrent, moderate: Secondary | ICD-10-CM | POA: Diagnosis not present

## 2018-07-28 NOTE — Progress Notes (Signed)
   THERAPIST PROGRESS NOTE  Session Time: 3:15pm-4pm  Participation Level: Active  Behavioral Response: Well GroomedAlertEuthymic  Type of Therapy: Individual Therapy  Treatment Goals addressed: Coping  Interventions: CBT, Motivational Interviewing and Supportive  Summary: Sonia Dawson is a 67 y.o. female who presents with symptoms of major depressive disorder. Client works with clinician to create new goal of focusing on improving self esteem and self confidence in order to increase assertive communication with family members. Client requests to be seen next after the start of the year due to multiple follow up medical appointments. Client will contact center if services are needed prior to next appointment.  Suicidal/Homicidal: Nowithout intent/plan  Therapist Response: Clinician met with client assessing for SI/HI/psychosis and overall level of functioning. Clinician and client discussed primary symptoms causing problem since last meeting. Client reports overall improved mood as she is compliant with medications. Client states she has had multiple medical appointments which is tirerining. Client reports overall the holidays went better than expected. Clinician praised client on her insight into current relationship dynamics based on previous relationships. Client is open to being challenged on standing up for herself and her wants, rather than continuing with passive or passive-aggressive communication styles.  Plan: Return again in 2-4 weeks.  Diagnosis: Axis I: Major Depression, Recurrent severe     Olegario Messier, LCSW 07/23/2018

## 2018-08-05 ENCOUNTER — Ambulatory Visit (HOSPITAL_COMMUNITY): Payer: Medicare HMO | Admitting: Psychiatry

## 2018-08-06 ENCOUNTER — Ambulatory Visit (HOSPITAL_COMMUNITY): Payer: Medicare HMO | Admitting: Licensed Clinical Social Worker

## 2018-08-27 ENCOUNTER — Ambulatory Visit (HOSPITAL_COMMUNITY): Payer: Medicare HMO | Admitting: Licensed Clinical Social Worker

## 2018-12-06 ENCOUNTER — Other Ambulatory Visit (HOSPITAL_COMMUNITY): Payer: Self-pay | Admitting: Psychiatry

## 2018-12-06 DIAGNOSIS — F411 Generalized anxiety disorder: Secondary | ICD-10-CM

## 2018-12-06 DIAGNOSIS — F331 Major depressive disorder, recurrent, moderate: Secondary | ICD-10-CM

## 2018-12-18 ENCOUNTER — Other Ambulatory Visit (HOSPITAL_COMMUNITY): Payer: Self-pay | Admitting: Psychiatry

## 2018-12-18 DIAGNOSIS — F411 Generalized anxiety disorder: Secondary | ICD-10-CM

## 2018-12-18 DIAGNOSIS — F331 Major depressive disorder, recurrent, moderate: Secondary | ICD-10-CM

## 2019-03-25 ENCOUNTER — Other Ambulatory Visit: Payer: Self-pay | Admitting: Nephrology

## 2019-03-25 DIAGNOSIS — N183 Chronic kidney disease, stage 3 unspecified: Secondary | ICD-10-CM

## 2019-03-25 DIAGNOSIS — E213 Hyperparathyroidism, unspecified: Secondary | ICD-10-CM

## 2019-03-31 ENCOUNTER — Ambulatory Visit
Admission: RE | Admit: 2019-03-31 | Discharge: 2019-03-31 | Disposition: A | Discharge status: Home / Self Care | Payer: Medicare HMO | Source: Ambulatory Visit | Attending: Nephrology | Admitting: Nephrology

## 2019-03-31 DIAGNOSIS — N183 Chronic kidney disease, stage 3 unspecified: Secondary | ICD-10-CM

## 2019-03-31 DIAGNOSIS — E213 Hyperparathyroidism, unspecified: Secondary | ICD-10-CM

## 2019-04-09 ENCOUNTER — Ambulatory Visit (INDEPENDENT_AMBULATORY_CARE_PROVIDER_SITE_OTHER): Payer: Medicare HMO

## 2019-04-09 ENCOUNTER — Ambulatory Visit: Payer: Medicare HMO | Admitting: Podiatry

## 2019-04-09 ENCOUNTER — Other Ambulatory Visit: Payer: Self-pay | Admitting: Podiatry

## 2019-04-09 ENCOUNTER — Other Ambulatory Visit: Payer: Self-pay

## 2019-04-09 ENCOUNTER — Encounter: Payer: Self-pay | Admitting: Podiatry

## 2019-04-09 ENCOUNTER — Encounter

## 2019-04-09 VITALS — BP 178/108 | Temp 97.6°F | Resp 16

## 2019-04-09 DIAGNOSIS — S92354A Nondisplaced fracture of fifth metatarsal bone, right foot, initial encounter for closed fracture: Secondary | ICD-10-CM | POA: Diagnosis not present

## 2019-04-09 DIAGNOSIS — M79671 Pain in right foot: Secondary | ICD-10-CM

## 2019-04-09 NOTE — Progress Notes (Signed)
Subjective:   Patient ID: Sonia Dawson, female   DOB: 68 y.o.   MRN: [id number]   HPI Patient presents stating the outside of the right foot has become constantly painful and has tried ice which helps a little bit and Tylenol but it feels like she is walking on a broken bone even though she does not remember any injury.  Patient smokes quarter pack per day   Review of Systems  All other systems reviewed and are negative.       Objective:  Physical Exam Vitals signs and nursing note reviewed.  Constitutional:      Appearance: She is well-developed.  Pulmonary:     Effort: Pulmonary effort is normal.  Musculoskeletal: Normal range of motion.  Skin:    General: Skin is warm.  Neurological:     Mental Status: She is alert.     Neurovascular status intact muscle strength is adequate range of motion within normal limits with exquisite discomfort on the lateral side of the right foot around the fifth metatarsal base.  There is quite a bit of swelling in the area and it is hard for her to bear weight on it currently.  Good digital perfusion well oriented x3 does have high blood pressure which she is going to check at home     Assessment:  Probability for fracture or other acute injury of the right lateral foot     Plan:  H&P x-rays reviewed as discussed the type of fracture he is she has and that has a high tendency to not heal and may ultimately require surgery.  Applied air fracture walker with all instructions on usage along with aggressive ice and will be seen back to recheck again in 3 weeks or earlier if needed  X-ray indicates that there is a fracture of the right fifth metatarsal distal to the base in the area of a classic Jones type fracture

## 2019-04-09 NOTE — Progress Notes (Signed)
   Subjective:    Patient ID: Sonia Dawson, female    DOB: Jun 23, 1951, 68 y.o.   MRN: [id number]  HPI    Review of Systems  All other systems reviewed and are negative.      Objective:   Physical Exam        Assessment & Plan:

## 2019-04-30 ENCOUNTER — Other Ambulatory Visit: Payer: Self-pay

## 2019-04-30 ENCOUNTER — Ambulatory Visit (INDEPENDENT_AMBULATORY_CARE_PROVIDER_SITE_OTHER): Payer: Medicare HMO

## 2019-04-30 ENCOUNTER — Other Ambulatory Visit: Payer: Self-pay | Admitting: Podiatry

## 2019-04-30 ENCOUNTER — Encounter: Payer: Self-pay | Admitting: Podiatry

## 2019-04-30 ENCOUNTER — Ambulatory Visit: Payer: Medicare HMO | Admitting: Podiatry

## 2019-04-30 DIAGNOSIS — S92354G Nondisplaced fracture of fifth metatarsal bone, right foot, subsequent encounter for fracture with delayed healing: Secondary | ICD-10-CM | POA: Diagnosis not present

## 2019-04-30 DIAGNOSIS — S92354D Nondisplaced fracture of fifth metatarsal bone, right foot, subsequent encounter for fracture with routine healing: Secondary | ICD-10-CM

## 2019-04-30 DIAGNOSIS — S92354A Nondisplaced fracture of fifth metatarsal bone, right foot, initial encounter for closed fracture: Secondary | ICD-10-CM

## 2019-04-30 NOTE — Progress Notes (Signed)
Subjective:   Patient ID: Sonia Dawson, female   DOB: 68 y.o.   MRN: [id number]   HPI Patient presents stating that her foot still hurts but it is improving as long she has the boot on   ROS      Objective:  Physical Exam  Neurovascular status intact with discomfort in the base of the fifth metatarsal right that still is sore with swelling with mild improvement upon deep palpation     Assessment:  Fracture of the base of the fifth metatarsal right which is still swollen and painful and is most likely still in initial phases of healing     Plan:  H&P x-ray reviewed and I advised on aggressive ice therapy anti-inflammatories and continued immobilization.  I educated her on Jones fracture and I do see multiple signs of healing I do think in the end it will heal uneventfully  X-ray indicates there is a healing fracture base of fifth metatarsal right with no indications currently that it is not good heal with secondary intent

## 2019-05-28 ENCOUNTER — Encounter: Payer: Self-pay | Admitting: Podiatry

## 2019-05-28 ENCOUNTER — Ambulatory Visit: Payer: Medicare HMO | Admitting: Podiatry

## 2019-05-28 ENCOUNTER — Other Ambulatory Visit: Payer: Self-pay

## 2019-05-28 ENCOUNTER — Ambulatory Visit (INDEPENDENT_AMBULATORY_CARE_PROVIDER_SITE_OTHER): Payer: Medicare HMO

## 2019-05-28 DIAGNOSIS — S92354D Nondisplaced fracture of fifth metatarsal bone, right foot, subsequent encounter for fracture with routine healing: Secondary | ICD-10-CM

## 2019-05-28 NOTE — Progress Notes (Signed)
Subjective:   Patient ID: Sonia Dawson, female   DOB: 68 y.o.   MRN: [id number]   HPI Patient states doing well stating the pain seems to be improving quite dramatically   ROS      Objective:  Physical Exam  Neurovascular status intact with patient's right lateral foot found to be significant improvement with mild pain upon deep palpation to the area     Assessment:  Appears to be improving from having had fracture of the fifth metatarsal at the Jones level     Plan:  H&P x-ray reviewed and resume normal activity and patient will be seen back as needed with total recovery to take another probably 4 to 8 weeks  X-ray indicates fracture continues to healing with slight gapping laterally but overall much better

## 2020-11-12 ENCOUNTER — Other Ambulatory Visit: Payer: Self-pay

## 2020-11-12 ENCOUNTER — Emergency Department (HOSPITAL_COMMUNITY)
Admission: EM | Admit: 2020-11-12 | Discharge: 2020-11-12 | Disposition: A | Discharge status: Home / Self Care | Payer: Medicare HMO | Attending: Emergency Medicine | Admitting: Emergency Medicine

## 2020-11-12 ENCOUNTER — Encounter (HOSPITAL_COMMUNITY): Payer: Self-pay | Admitting: Emergency Medicine

## 2020-11-12 DIAGNOSIS — J449 Chronic obstructive pulmonary disease, unspecified: Secondary | ICD-10-CM | POA: Insufficient documentation

## 2020-11-12 DIAGNOSIS — Z7982 Long term (current) use of aspirin: Secondary | ICD-10-CM | POA: Diagnosis not present

## 2020-11-12 DIAGNOSIS — N189 Chronic kidney disease, unspecified: Secondary | ICD-10-CM | POA: Insufficient documentation

## 2020-11-12 DIAGNOSIS — I129 Hypertensive chronic kidney disease with stage 1 through stage 4 chronic kidney disease, or unspecified chronic kidney disease: Secondary | ICD-10-CM | POA: Insufficient documentation

## 2020-11-12 DIAGNOSIS — J45909 Unspecified asthma, uncomplicated: Secondary | ICD-10-CM | POA: Insufficient documentation

## 2020-11-12 DIAGNOSIS — Z96651 Presence of right artificial knee joint: Secondary | ICD-10-CM | POA: Diagnosis not present

## 2020-11-12 DIAGNOSIS — I83899 Varicose veins of unspecified lower extremities with other complications: Secondary | ICD-10-CM | POA: Diagnosis present

## 2020-11-12 DIAGNOSIS — F17210 Nicotine dependence, cigarettes, uncomplicated: Secondary | ICD-10-CM | POA: Insufficient documentation

## 2020-11-12 DIAGNOSIS — N289 Disorder of kidney and ureter, unspecified: Secondary | ICD-10-CM

## 2020-11-12 DIAGNOSIS — E039 Hypothyroidism, unspecified: Secondary | ICD-10-CM | POA: Diagnosis not present

## 2020-11-12 LAB — COMPREHENSIVE METABOLIC PANEL
ALT: 14 U/L (ref 0–44)
AST: 20 U/L (ref 15–41)
Albumin: 3.8 g/dL (ref 3.5–5.0)
Alkaline Phosphatase: 64 U/L (ref 38–126)
Anion gap: 10 (ref 5–15)
BUN: 43 mg/dL — ABNORMAL HIGH (ref 8–23)
CO2: 22 mmol/L (ref 22–32)
Calcium: 8.5 mg/dL — ABNORMAL LOW (ref 8.9–10.3)
Chloride: 106 mmol/L (ref 98–111)
Creatinine, Ser: 1.82 mg/dL — ABNORMAL HIGH (ref 0.44–1.00)
GFR, Estimated: 30 mL/min — ABNORMAL LOW (ref >60)
Glucose, Bld: 141 mg/dL — ABNORMAL HIGH (ref 70–99)
Potassium: 4.4 mmol/L (ref 3.5–5.1)
Sodium: 138 mmol/L (ref 135–145)
Total Bilirubin: 0.6 mg/dL (ref 0.3–1.2)
Total Protein: 7.4 g/dL (ref 6.5–8.1)

## 2020-11-12 LAB — CBC WITH DIFFERENTIAL/PLATELET
Abs Immature Granulocytes: 0.01 10*3/uL (ref 0.00–0.07)
Basophils Absolute: 0.1 10*3/uL (ref 0.0–0.1)
Basophils Relative: 1 %
Eosinophils Absolute: 0.1 10*3/uL (ref 0.0–0.5)
Eosinophils Relative: 2 %
HCT: 35.1 % — ABNORMAL LOW (ref 36.0–46.0)
Hemoglobin: 11.4 g/dL — ABNORMAL LOW (ref 12.0–15.0)
Immature Granulocytes: 0 %
Lymphocytes Relative: 28 %
Lymphs Abs: 1.8 10*3/uL (ref 0.7–4.0)
MCH: 29 pg (ref 26.0–34.0)
MCHC: 32.5 g/dL (ref 30.0–36.0)
MCV: 89.3 fL (ref 80.0–100.0)
Monocytes Absolute: 0.7 10*3/uL (ref 0.1–1.0)
Monocytes Relative: 11 %
Neutro Abs: 3.6 10*3/uL (ref 1.7–7.7)
Neutrophils Relative %: 58 %
Platelets: 199 10*3/uL (ref 150–400)
RBC: 3.93 MIL/uL (ref 3.87–5.11)
RDW: 13.7 % (ref 11.5–15.5)
WBC: 6.2 10*3/uL (ref 4.0–10.5)
nRBC: 0 % (ref 0.0–0.2)

## 2020-11-12 LAB — PROTIME-INR
INR: 1.1 (ref 0.8–1.2)
Prothrombin Time: 13.6 seconds (ref 11.4–15.2)

## 2020-11-12 NOTE — Discharge Instructions (Signed)
You were seen in the [facility] department today with bleeding from a varicose vein.  The bleeding stopped with holding pressure.  I have wrapped a loose bandage around this to keep it protected and prevent additional bleeding.  If you notice discoloration to the foot or numbness you should remove the dressing to make it more loose.  Please follow-up with your primary care doctor.  Your blood counts today were slightly lower than in January but are not near the point of needing a blood transfusion.  Your primary care doctor may want to follow these lab tests in the office.
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
## 2020-11-12 NOTE — ED Triage Notes (Addendum)
Patient states about 1 hour prior to arrival, a varicose vein on her leg began to "spurt." Family called EMS and EMS wrapped it and wanted to transport patient but she had family bring her. Patient states she had also had bilateral foot swelling. Patient's leg unwrapped, and there was a continuous stream of blood. Pressure applied with gauze dressing and coban.

## 2021-03-01 IMAGING — US US RENAL
1 series · 14 of 25 positions shown · non-contrast
Comparison: None.

CLINICAL DATA: 68-year-old female with hypercalcemia. Chronic
kidney disease.

EXAM:
RENAL / URINARY TRACT ULTRASOUND COMPLETE

[Series 1: us renal · 0.26mm/px · 14 of 28 slices shown]
[im 1/28]
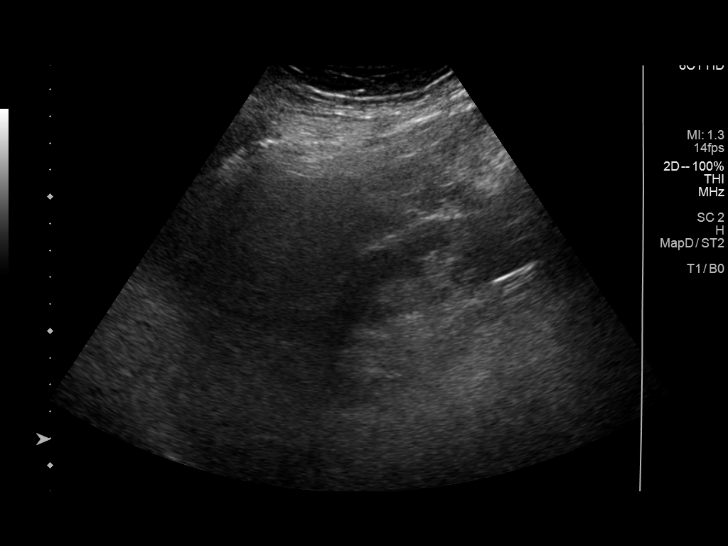
[im 3/28]
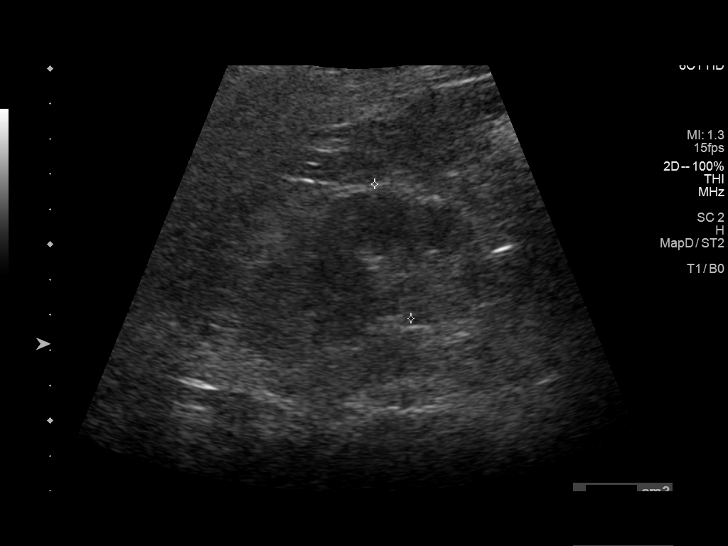
[im 5/28]
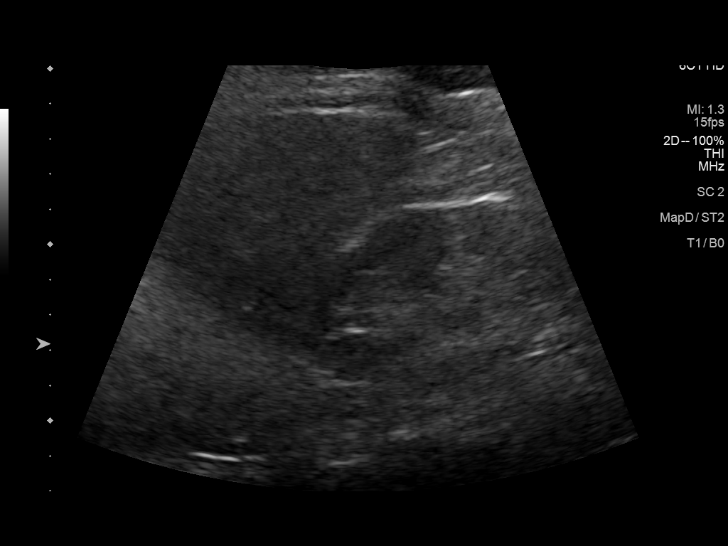
[im 7/28]
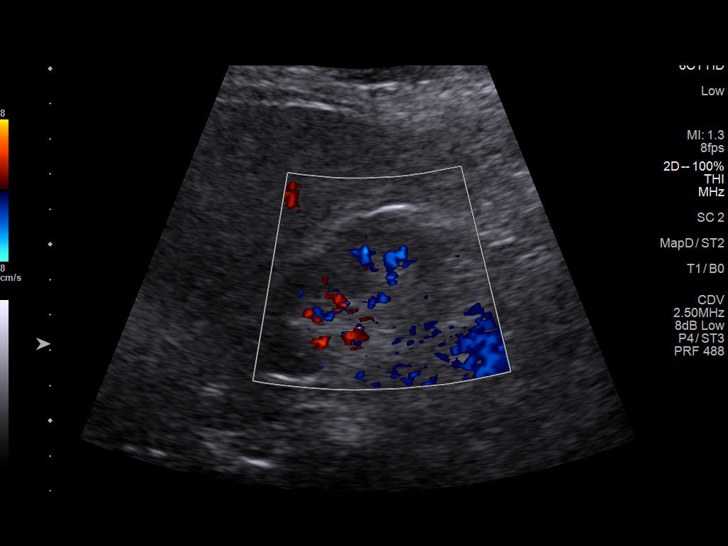
[im 10/28]
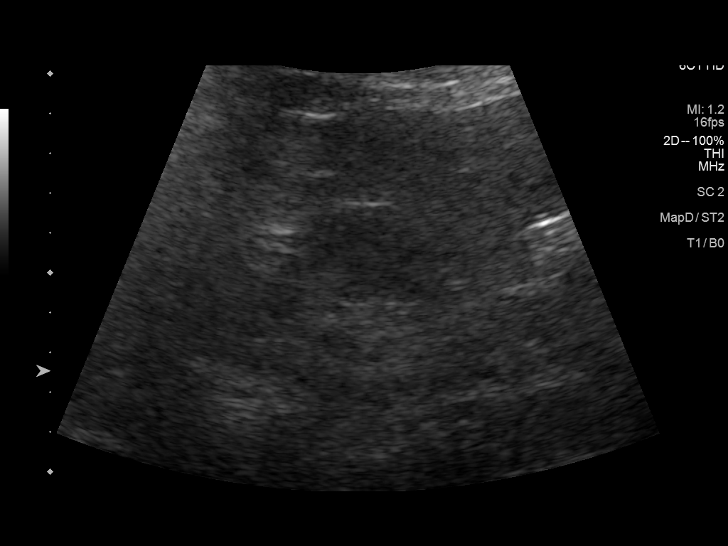
[im 11/28]
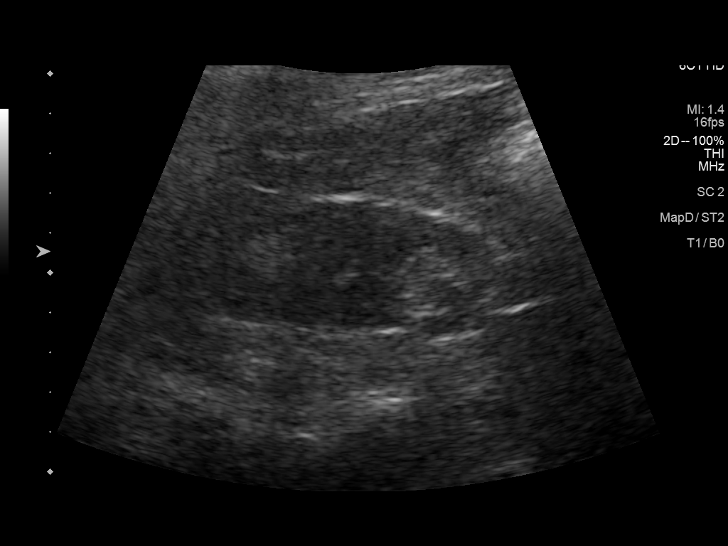
[im 13/28]
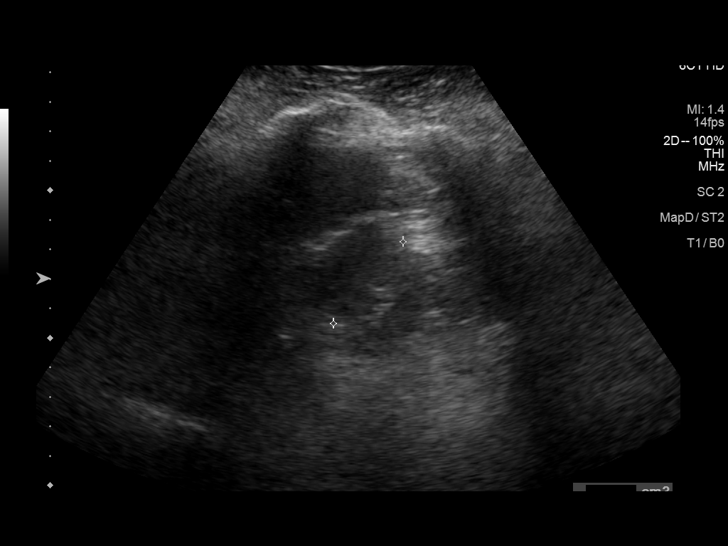
[im 15/28]
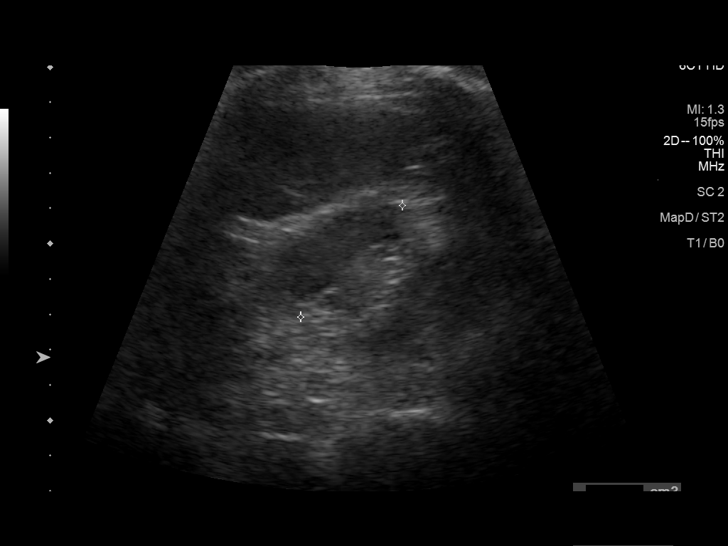
[im 17/28]
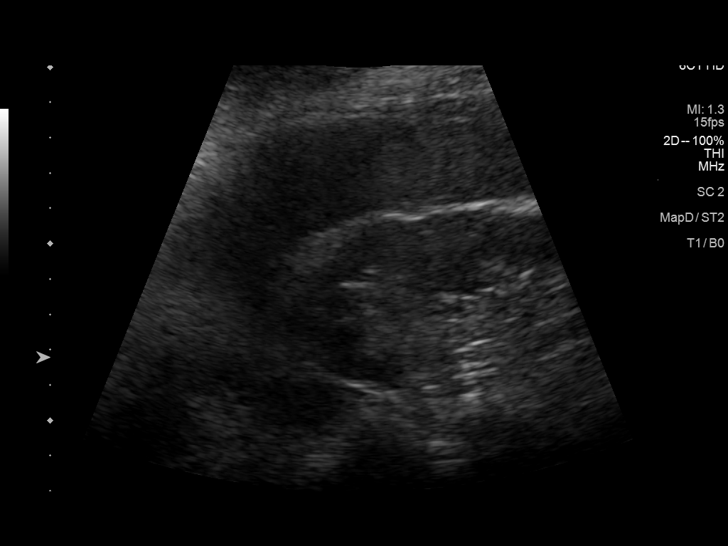
[im 19/28]
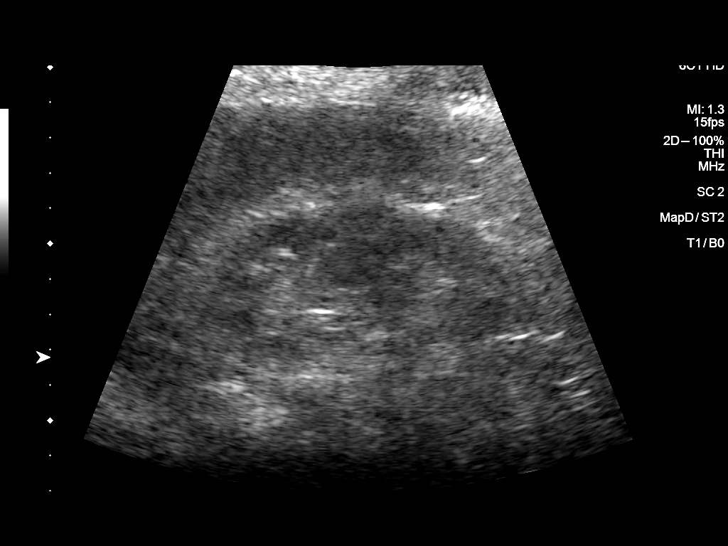
[im 21/28]
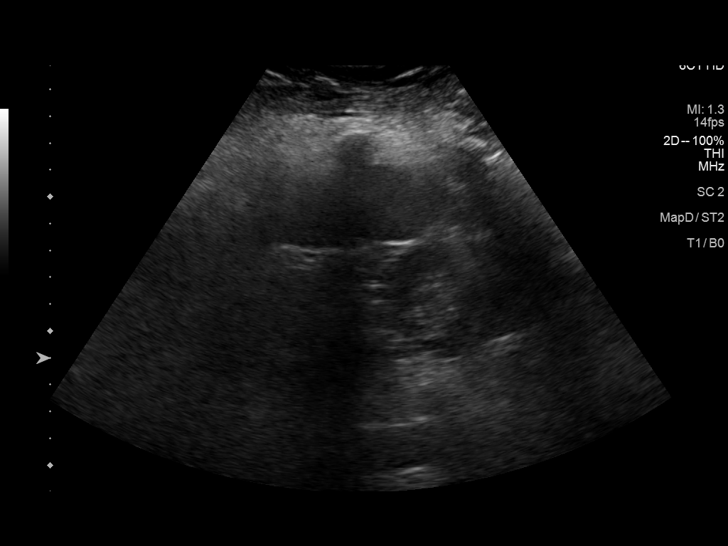
[im 23/28]
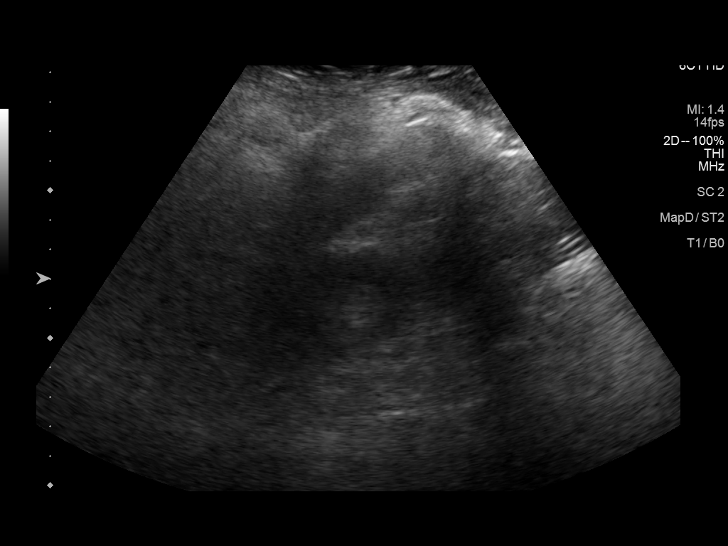
[im 25/28]
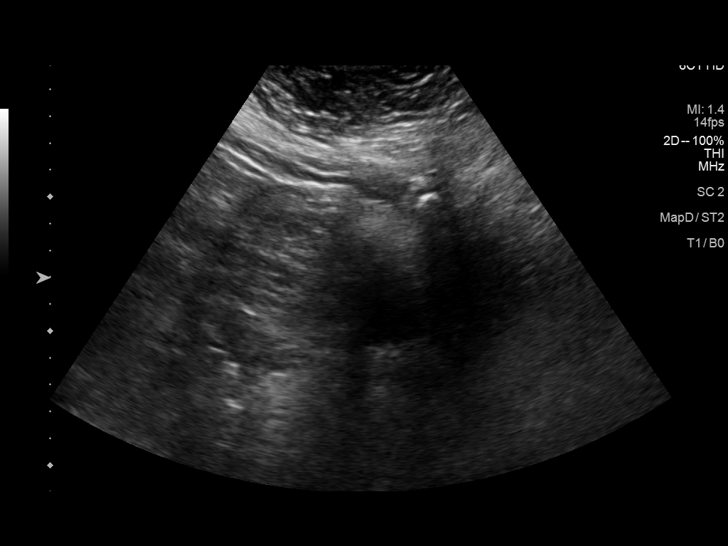
[im 28/28]
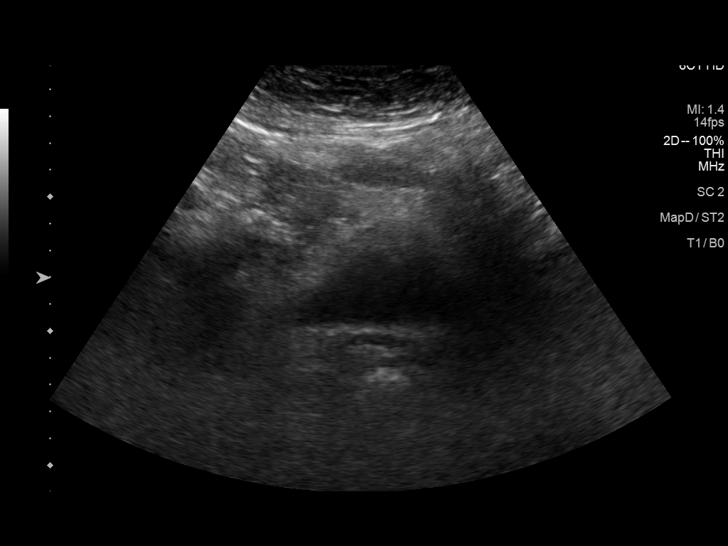

[14 of 25 positions shown; findings below may reference images not displayed]

FINDINGS: Evaluation is limited to body habitus and technique.

Right Kidney:

Renal measurements: 9.6 x 4.0 x 4.0 cm = volume: 80 mL. Normal
echogenicity. Mild parenchyma atrophy and cortical thinning. No
hydronephrosis or shadowing stone.

Left Kidney:

Renal measurements: 9.2 x 4.4 x 8.6 cm = volume: 78 mL. Normal
echogenicity. Mild parenchyma atrophy and cortical thinning. No
hydronephrosis or shadowing stone.

Bladder:

Minimally distended and grossly unremarkable as visualized.

Apparent fatty infiltration of the liver.
IMPRESSION: No hydronephrosis or shadowing stone.
